# Patient Record
Sex: Female | Born: 1965
Health system: Southern US, Community
[De-identification: ages and names within clinical notes are randomized; demographics above are authoritative.]

## PROBLEM LIST (undated history)

## (undated) DIAGNOSIS — I10 Essential (primary) hypertension: Secondary | ICD-10-CM

## (undated) DIAGNOSIS — N200 Calculus of kidney: Secondary | ICD-10-CM

## (undated) DIAGNOSIS — I471 Supraventricular tachycardia, unspecified: Secondary | ICD-10-CM

## (undated) DIAGNOSIS — R112 Nausea with vomiting, unspecified: Secondary | ICD-10-CM

## (undated) DIAGNOSIS — E042 Nontoxic multinodular goiter: Secondary | ICD-10-CM

## (undated) DIAGNOSIS — Q7101 Congenital complete absence of right upper limb: Secondary | ICD-10-CM

## (undated) DIAGNOSIS — Z9889 Other specified postprocedural states: Secondary | ICD-10-CM

## (undated) DIAGNOSIS — A388 Scarlet fever with other complications: Secondary | ICD-10-CM

## (undated) DIAGNOSIS — Z87442 Personal history of urinary calculi: Secondary | ICD-10-CM

## (undated) HISTORY — DX: Scarlet fever with other complications: A38.8

## (undated) HISTORY — PX: PERCUTANEOUS NEPHROSTOLITHOTOMY: SHX2207

## (undated) HISTORY — PX: OTHER SURGICAL HISTORY: SHX169

---
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
## 1998-02-15 ENCOUNTER — Ambulatory Visit (HOSPITAL_COMMUNITY): Admission: RE | Admit: 1998-02-15 | Discharge: 1998-02-15 | Payer: Self-pay | Admitting: Urology

## 1998-02-15 ENCOUNTER — Encounter: Payer: Self-pay | Admitting: Urology

## 1998-04-22 ENCOUNTER — Other Ambulatory Visit: Admission: RE | Admit: 1998-04-22 | Discharge: 1998-04-22 | Payer: Self-pay | Admitting: Gynecology

## 1999-06-11 ENCOUNTER — Emergency Department (HOSPITAL_COMMUNITY): Admission: EM | Admit: 1999-06-11 | Discharge: 1999-06-12 | Payer: Self-pay | Admitting: Internal Medicine

## 1999-12-12 ENCOUNTER — Encounter: Payer: Self-pay | Admitting: Urology

## 1999-12-12 ENCOUNTER — Encounter: Admission: RE | Admit: 1999-12-12 | Discharge: 1999-12-12 | Payer: Self-pay | Admitting: Urology

## 1999-12-13 ENCOUNTER — Ambulatory Visit (HOSPITAL_COMMUNITY): Admission: RE | Admit: 1999-12-13 | Discharge: 1999-12-14 | Payer: Self-pay | Admitting: Urology

## 1999-12-13 ENCOUNTER — Encounter: Payer: Self-pay | Admitting: Urology

## 2000-04-11 ENCOUNTER — Other Ambulatory Visit: Admission: RE | Admit: 2000-04-11 | Discharge: 2000-04-11 | Payer: Self-pay | Admitting: Gynecology

## 2000-07-03 ENCOUNTER — Encounter: Payer: Self-pay | Admitting: Urology

## 2000-07-03 ENCOUNTER — Ambulatory Visit (HOSPITAL_COMMUNITY): Admission: RE | Admit: 2000-07-03 | Discharge: 2000-07-03 | Payer: Self-pay | Admitting: Urology

## 2000-07-03 ENCOUNTER — Emergency Department (HOSPITAL_COMMUNITY): Admission: EM | Admit: 2000-07-03 | Discharge: 2000-07-04 | Payer: Self-pay | Admitting: Emergency Medicine

## 2000-07-03 HISTORY — PX: OTHER SURGICAL HISTORY: SHX169

## 2000-07-10 ENCOUNTER — Ambulatory Visit (HOSPITAL_COMMUNITY): Admission: RE | Admit: 2000-07-10 | Discharge: 2000-07-10 | Payer: Self-pay | Admitting: Urology

## 2000-07-10 HISTORY — PX: OTHER SURGICAL HISTORY: SHX169

## 2001-03-18 ENCOUNTER — Ambulatory Visit (HOSPITAL_COMMUNITY): Admission: RE | Admit: 2001-03-18 | Discharge: 2001-03-18 | Payer: Self-pay | Admitting: Urology

## 2001-03-19 ENCOUNTER — Encounter: Payer: Self-pay | Admitting: Urology

## 2001-03-19 ENCOUNTER — Encounter: Admission: RE | Admit: 2001-03-19 | Discharge: 2001-03-19 | Payer: Self-pay | Admitting: Urology

## 2002-05-27 ENCOUNTER — Encounter: Admission: RE | Admit: 2002-05-27 | Discharge: 2002-05-27 | Payer: Self-pay | Admitting: Urology

## 2002-05-27 ENCOUNTER — Encounter: Payer: Self-pay | Admitting: Urology

## 2002-05-30 ENCOUNTER — Encounter: Payer: Self-pay | Admitting: Urology

## 2002-05-30 ENCOUNTER — Observation Stay (HOSPITAL_COMMUNITY): Admission: RE | Admit: 2002-05-30 | Discharge: 2002-05-31 | Payer: Self-pay | Admitting: Urology

## 2002-06-05 ENCOUNTER — Ambulatory Visit (HOSPITAL_COMMUNITY): Admission: AD | Admit: 2002-06-05 | Discharge: 2002-06-05 | Payer: Self-pay | Admitting: Urology

## 2003-12-03 ENCOUNTER — Encounter: Admission: RE | Admit: 2003-12-03 | Discharge: 2003-12-03 | Payer: Self-pay | Admitting: Urology

## 2004-04-06 ENCOUNTER — Other Ambulatory Visit: Admission: RE | Admit: 2004-04-06 | Discharge: 2004-04-06 | Payer: Self-pay | Admitting: Obstetrics & Gynecology

## 2004-04-09 ENCOUNTER — Encounter (INDEPENDENT_AMBULATORY_CARE_PROVIDER_SITE_OTHER): Payer: Self-pay | Admitting: Specialist

## 2004-04-09 ENCOUNTER — Ambulatory Visit (HOSPITAL_COMMUNITY): Admission: AD | Admit: 2004-04-09 | Discharge: 2004-04-09 | Payer: Self-pay | Admitting: Obstetrics & Gynecology

## 2004-04-09 HISTORY — PX: DILATION AND CURETTAGE OF UTERUS: SHX78

## 2004-08-25 ENCOUNTER — Ambulatory Visit: Payer: Self-pay | Admitting: *Deleted

## 2004-08-31 ENCOUNTER — Ambulatory Visit: Payer: Self-pay | Admitting: *Deleted

## 2004-12-09 ENCOUNTER — Encounter: Admission: RE | Admit: 2004-12-09 | Discharge: 2004-12-09 | Payer: Self-pay | Admitting: Orthopedic Surgery

## 2005-02-08 ENCOUNTER — Encounter: Admission: RE | Admit: 2005-02-08 | Discharge: 2005-02-08 | Payer: Self-pay | Admitting: Urology

## 2005-02-10 ENCOUNTER — Ambulatory Visit (HOSPITAL_BASED_OUTPATIENT_CLINIC_OR_DEPARTMENT_OTHER): Admission: RE | Admit: 2005-02-10 | Discharge: 2005-02-10 | Payer: Self-pay | Admitting: Urology

## 2005-02-10 ENCOUNTER — Ambulatory Visit (HOSPITAL_COMMUNITY): Admission: RE | Admit: 2005-02-10 | Discharge: 2005-02-10 | Payer: Self-pay | Admitting: Urology

## 2005-02-17 ENCOUNTER — Ambulatory Visit (HOSPITAL_COMMUNITY): Admission: RE | Admit: 2005-02-17 | Discharge: 2005-02-17 | Payer: Self-pay | Admitting: Urology

## 2005-02-17 ENCOUNTER — Ambulatory Visit (HOSPITAL_BASED_OUTPATIENT_CLINIC_OR_DEPARTMENT_OTHER): Admission: RE | Admit: 2005-02-17 | Discharge: 2005-02-17 | Payer: Self-pay | Admitting: Urology

## 2005-02-27 ENCOUNTER — Encounter: Admission: RE | Admit: 2005-02-27 | Discharge: 2005-02-27 | Payer: Self-pay | Admitting: Urology

## 2005-09-06 ENCOUNTER — Encounter: Payer: Self-pay | Admitting: Urology

## 2006-02-22 ENCOUNTER — Ambulatory Visit: Payer: Self-pay | Admitting: *Deleted

## 2006-04-17 ENCOUNTER — Encounter: Admission: RE | Admit: 2006-04-17 | Discharge: 2006-04-17 | Payer: Self-pay | Admitting: Urology

## 2006-04-27 ENCOUNTER — Encounter: Admission: RE | Admit: 2006-04-27 | Discharge: 2006-04-27 | Payer: Self-pay | Admitting: Urology

## 2006-05-02 ENCOUNTER — Ambulatory Visit (HOSPITAL_COMMUNITY): Admission: RE | Admit: 2006-05-02 | Discharge: 2006-05-03 | Payer: Self-pay | Admitting: Urology

## 2006-05-04 ENCOUNTER — Ambulatory Visit (HOSPITAL_COMMUNITY): Admission: RE | Admit: 2006-05-04 | Discharge: 2006-05-04 | Payer: Self-pay | Admitting: Urology

## 2007-03-01 ENCOUNTER — Ambulatory Visit: Payer: Self-pay | Admitting: Cardiology

## 2007-07-16 ENCOUNTER — Encounter: Admission: RE | Admit: 2007-07-16 | Discharge: 2007-07-16 | Payer: Self-pay | Admitting: Obstetrics & Gynecology

## 2008-01-15 ENCOUNTER — Encounter: Admission: RE | Admit: 2008-01-15 | Discharge: 2008-01-15 | Payer: Self-pay | Admitting: Urology

## 2008-01-22 ENCOUNTER — Ambulatory Visit (HOSPITAL_BASED_OUTPATIENT_CLINIC_OR_DEPARTMENT_OTHER): Admission: RE | Admit: 2008-01-22 | Discharge: 2008-01-22 | Payer: Self-pay | Admitting: Urology

## 2008-01-22 HISTORY — PX: OTHER SURGICAL HISTORY: SHX169

## 2008-02-26 ENCOUNTER — Ambulatory Visit: Payer: Self-pay | Admitting: Cardiology

## 2008-08-12 ENCOUNTER — Encounter: Admission: RE | Admit: 2008-08-12 | Discharge: 2008-08-12 | Payer: Self-pay | Admitting: Obstetrics & Gynecology

## 2008-12-16 ENCOUNTER — Encounter (INDEPENDENT_AMBULATORY_CARE_PROVIDER_SITE_OTHER): Payer: Self-pay | Admitting: *Deleted

## 2009-04-15 DIAGNOSIS — I10 Essential (primary) hypertension: Secondary | ICD-10-CM

## 2009-04-15 DIAGNOSIS — E7200 Disorders of amino-acid transport, unspecified: Secondary | ICD-10-CM | POA: Insufficient documentation

## 2009-04-15 DIAGNOSIS — Z8679 Personal history of other diseases of the circulatory system: Secondary | ICD-10-CM | POA: Insufficient documentation

## 2009-04-16 ENCOUNTER — Ambulatory Visit: Payer: Self-pay | Admitting: Cardiology

## 2009-07-02 ENCOUNTER — Encounter: Admission: RE | Admit: 2009-07-02 | Discharge: 2009-07-02 | Payer: Self-pay | Admitting: Urology

## 2009-08-16 ENCOUNTER — Encounter: Admission: RE | Admit: 2009-08-16 | Discharge: 2009-08-16 | Payer: Self-pay | Admitting: Obstetrics & Gynecology

## 2009-11-03 ENCOUNTER — Telehealth: Payer: Self-pay | Admitting: Cardiology

## 2010-06-07 NOTE — Progress Notes (Signed)
Summary: skipping beats/flutter  Phone Note Call from Patient Call back at Home Phone [phone] Call back at [phone]   Caller: Patient Reason for Call: Talk to Nurse Summary of Call: heart skipping beats on Sunday and flutteringtoday,thought she was going to pass out; request to speak to nurse Initial call taken by: Migdalia Dk,  November 03, 2009 2:36 PM  Follow-up for Phone Call        spoke with pt, she had an episode of heart fluttering on sunday evening and has felt fine since then until today. she was sitting at her desk, pushed her chair back and suddenly she had severe flutting in her chest. she states she felt faint and the room was going black. she put her head down between her legs and after one minute the palpitations went away. she was dizzy after the episode for about 1-2 minutes but feels fine now. she takes her toprol once daily. denies any opther symptoms. will foward for dr Jens Som review Deliah Goody, RN  November 03, 2009 3:20 PM   Additional Follow-up for Phone Call Additional follow up Details #1::        schedule cardionet and then f/u ov Ferman Hamming, MD, [facility]  November 03, 2009 3:25 PM  spoke with pt, she does not want to do the monitor right now. she has not been sleeping well and feels that may be a contributing factor. she wants to wait and see if it happens again. if episode reoccurs she will call me back Deliah Goody, RN  November 03, 2009 3:44 PM

## 2010-07-21 ENCOUNTER — Other Ambulatory Visit: Payer: Self-pay | Admitting: Obstetrics & Gynecology

## 2010-07-21 DIAGNOSIS — Z1231 Encounter for screening mammogram for malignant neoplasm of breast: Secondary | ICD-10-CM

## 2010-08-23 ENCOUNTER — Ambulatory Visit
Admission: RE | Admit: 2010-08-23 | Discharge: 2010-08-23 | Disposition: A | Discharge status: Home / Self Care | Payer: Self-pay | Source: Ambulatory Visit | Attending: Obstetrics & Gynecology | Admitting: Obstetrics & Gynecology

## 2010-08-23 DIAGNOSIS — Z1231 Encounter for screening mammogram for malignant neoplasm of breast: Secondary | ICD-10-CM

## 2010-09-20 NOTE — Assessment & Plan Note (Signed)
[facility]                            CARDIOLOGY OFFICE NOTE   NAME:[name] [name], [name] [name]                      MRN:          [id number]  DATE:02/26/2008                            DOB:          Feb 28, 1966    [name] [name] is an extremely pleasant 45 year old female who has a  history of hypertension, SVT, and cystinuria.  A previous echocardiogram  in September 2003, showed normal LV function and Myoview performed in  September 2003, showed no scar or ischemia with an ejection fraction  74%.  Since I last saw her, she has lost approximately 30 pounds.  She  does state that occasionally she feels dizziness with standing that  improves after several seconds.  She has not had frank syncope.  She  denies any chest pain or dyspnea on exertion and there is no pedal  edema.  Also note, she apparently has problems of cystinuria and had  kidney stones.  She said that her neurologist suggested possible  initiation of captopril, but she was hesitant to do this as a blood  pressure runs in the systolic of 90-100.   MEDICATIONS:  1. Aspirin 81 mg p.o. daily as needed.  2. Toprol 50 mg p.o. daily.   PHYSICAL EXAMINATION:  VITAL SIGNS:  Blood pressure of 109/62 and pulse  is 64.  HEENT:  Normal.  NECK:  Supple.  CHEST:  Clear.  CARDIOVASCULAR:  Regular rate and rhythm.  EXTREMITIES:  No edema.   Electrocardiogram shows sinus bradycardia at a rate of 53.  The axis is  normal.  No ST changes noted.  Early repolarization abnormality noted.   DIAGNOSIS:  1. Supraventricular tachycardia - the patient appears to be doing well      from a symptomatic standpoint.  Note, we did discuss discontinuing      her Toprol and potentially starting captopril for cystinuria.      However, she states that when she has been off of Toprol in the      past in 1 day, she has increased palpitations.  We will, therefore,      not do this.  She also is having orthostatic symptoms and   apparently her blood pressure is running low.  I would decrease the      Toprol to 25 mg p.o. daily.  If her palpitations worsen then we      could potentially increase this in the future versus trying to      proceed with cardiac event monitor.  If we document significant      arrhythmia then we could potentially refer for ablation as      indicated.  2. Hypertension - as per above, we are decreasing her Toprol due to      orthostatic symptoms.  3. Cystinuria - I have recommended that she did not start captopril as      I think this would exacerbate her orthostatic symptoms and may      cause hypotension.   We will see her back in 12 months.     Madolyn Frieze Jens Som, MD, [facility]  Electronically  Signed    BSC/MedQ  DD: 02/26/2008  DT: 02/26/2008  Job #: [id number]

## 2010-09-20 NOTE — Op Note (Signed)
NAME:  [name] [name], [name] [name]               ACCOUNT NO.:  [id number]   MEDICAL RECORD NO.:  [id number]          PATIENT TYPE:  AMB   LOCATION:  [facility]                         FACILITY:  [facility]   PHYSICIAN:  Mark C. Vernie Ammons, M.D.  DATE OF BIRTH:  1966/02/12   DATE OF PROCEDURE:  DATE OF DISCHARGE:                               OPERATIVE REPORT   PREOPERATIVE DIAGNOSIS:  Bilateral renal calculi with symptomatic right  renal calculi.   POSTOPERATIVE DIAGNOSIS:  Bilateral renal calculi.   PROCEDURE:  1. Cystoscopy.  2. Bilateral ureteroscopy.  3. Bilateral stone extraction.  4. Bilateral double-J stent placement.  5. Left in situ laser lithotripsy.   SURGEON:  Mark C. Vernie Ammons, M.D.   ANESTHESIA:  General.   SPECIMENS:  Stone given to the patient.   BLOOD LOSS:  Minimal.   DRAINS:  5-French 24-cm Polaris stent with string in both right and left  ureters.   COMPLICATIONS:  None.   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  She has  been having intermittent pain in her right flank for about 1 week.  A CT  scan revealed one 5-mm stone and 1 punctate stone in her right kidney as  well as a 7-mm and 5-mm stone in the left kidney.  We discussed  treatment options and because she is symptomatic on the right-hand side,  she elected to proceed with ureteroscopic extraction of the stone.  My  thought was that the stone was likely ball valving into the  ureteropelvic junction and causing her intermittent pain.  The risks,  complications and alternatives have been discussed.  She understands and  elected to proceed.   DESCRIPTION OF OPERATION:  After informed consent, the patient was  brought to the major OR, placed on the table and administered general  anesthesia, then moved to the dorsal lithotomy position.  Her genitalia  were sterilely prepped and draped and an official time-out was then  performed.   Initially the 22-French cystoscope was inserted into the  bladder.  The bladder was fully inspected and noted be free of any  tumor, stones or inflammatory lesions.  The ureteral orifices were noted  to be normal in configuration and position.  The right orifice was  identified and a 0.038- inch floppy-tip guidewire was then passed  through the cystoscope, up the right ureter under direct fluoroscopy  into the area of the renal pelvis.  I then removed the cystoscope and  performed ureteral dilation.   The ureter was dilated gently initially with the inner cannula of the  ureteral access sheath.  This dilated very smoothly and easily.  I then  removed that and then reassembled the entire ureteral access sheath and  then passed this over the guidewire easily into the area of the renal  pelvis.  The inner cannula and guidewire were then removed.   The 6-French flexible ureteroscope was then passed up the access sheath  into the area of the renal pelvis.  Initially I noted there were no  stones seen  free floating within the renal pelvis.  Visualization was  excellent.  I then did a systematic inspection of all calyces, starting  at the upper pole and noting it was compound.  Each calix was inspected  and no stones were seen in the upper pole.  In the middle pole a single  calix was found to have a stone but it appeared to be wedged against a  papilla and the wall of the infundibulum and did not appear to be free  floating.  I inspected the lower pole calix and found no stones in any  of these.   Using the nitinol basket, I was able to manipulate the stone in the mid  to lower pole calix and I was then able to extract it completely.  I  then reinserted the ureteroscope into the kidney and reinspected the  kidney again a second time, confirming my location using intermittent  pulse fluoroscopy.  I inspected the entire kidney and no further stones  could be identified within the kidney,  however, in an upper pole calix,  I could see a fragment of uric acid stone beneath the mucosa of the  papilla.  This certainly did not warrant extraction as it was completely  imbedded within the kidney.  I therefore passed a guidewire up the  ureter into the area of the renal pelvis and removed the access sheath  as well as ureteroscope.   I then called the patient's husband, who was in the waiting room, and  discussed the fact that I was easily able to extract all stones on the  right-hand side and because she had 2 known stones on the left-hand  side, proposed removing those as well.  The decision was made by her  husband and myself to proceed with treatment of the left side since she  was under general anesthesia in order to completely rid her of stones.   I therefore dilated the left ureter in an identical fashion, first  passing the guidewire using the cystoscope, then using the inner portion  and outer portions of the ureteral access sheath, followed by the  flexible ureteroscope.  I was able to identify a very large stone in a  mid pole calix and grasped that with the nitinol basket.  I was able to  extract it nearly all the way but it got held up in the intramural  ureter and therefore I left it in the nitinol basket and used the 6-  [place] rigid ureteroscope to visualize the stone and fragment it.   Through the 6-French rigid ureteroscope, a 360 micron holmium laser  fiber was passed and with a setting of 0.6 joules and a rate of 6 Hz, I  fragmented the stone.  I then removed each of the fragments with the  nitinol basket.  I then reinspected the distal ureter with the rigid  ureteroscope and noted no further fragments present.  I then replaced  the guidewire, replaced the ureteral access sheath, and passed the  flexible scope into the kidney again.  I continued my systematic search  of all calyces and found a second stone in a calix in the lower pole.  This appeared  distorted by previous surgery but I was able to grasp the  stone in the basket and then was able to extract it completely with the  ureteroscope.  I then reinserted the ureteroscope a last time,  reinspected the entire left kidney and looked in each calix and found  no  further stones present.  I therefore replaced the guidewire and removed  the ureteroscope and access sheath.   I back loaded the cystoscope over the guidewire and first passed the  stent up the left ureter under direct fluoroscopy.  As I removed the  guidewire, good curl was noted in the left renal pelvis.  I left the  string affixed to the end of the stent and proceeded to place a stent on  the right-hand side in an identical fashion.  The stents were left with  the tethers attached, which were affixed to the lower abdomen, and the  patient was awakened and taken to the recovery room in stable and  satisfactory condition.  She tolerated the procedure well.  There were  no intraoperative complications.   She is going to be given a prescription for Vicodin HP #40, Cipro 250 mg  h.s. #7, and Pyridium 200 mg q.8 h p.r.n. #40.  She will return to my  office in 5-6 days for stent removal.      Mark C. Vernie Ammons, M.D.  Electronically Signed     MCO/MEDQ  D:  01/22/2008  T:  01/23/2008  Job:  [id number]

## 2010-09-20 NOTE — Assessment & Plan Note (Signed)
[facility]                            CARDIOLOGY OFFICE NOTE   NAME:[name] [name], [name] [name]                      MRN:          [id number]  DATE:03/01/2007                            DOB:          02/15/1966    [name] [name] is an extremely pleasant 45 year old female who has a  history of hypertension, SVT, and cystinuria.  She has previously been  followed by Dr. Glennon Hamilton.  She had her last echocardiogram in  September, 2003.  Her ejection fraction was normal.  There was trace  mitral regurgitation.  She also apparently had a Myoview performed in  September, 2003 that showed no scar or ischemia, and her ejection  fraction was 74%.  She has done well and has taken Toprol for her  palpitations in the past.  According to the notes, she does have SVT,  although I do see no actual rhythm strips from this.  Since she was last  seen, there is no dyspnea or chest pain and only rare palpitations that  are self-limited.  There has been no syncope.   MEDICATIONS:  1. Aspirin 81 mg p.o. daily.  2. Toprol 50 mg p.o. daily.   PHYSICAL EXAMINATION:  Blood pressure 126/80, pulse 75.  She weighs 155  pounds.  HEENT:  Normal.  NECK:  Supple with no bruits.  CHEST:  Clear.  CARDIOVASCULAR:  Regular rate and rhythm with no murmurs, rubs or  gallops.  ABDOMEN:  No tenderness.  EXTREMITIES:  No edema.   Electrocardiogram shows a sinus rhythm, and her rate is 75.  There are  minor nonspecific ST changes.   DIAGNOSES:  1. History of supraventricular tachycardia:  We will continue with her      Toprol and her symptoms are well controlled at present.  If she has      worsening palpitations in the future, then we will most likely      proceed with a Cardionet monitor to document her rhythm      disturbance.  We have refilled her Toprol for 24 months, and I will      see her back at that time.  2. Hypertension:  Her blood pressure is well controlled on her present  medications.  3. Cystinuria.     Madolyn Frieze Jens Som, MD, [facility]  Electronically Signed    BSC/MedQ  DD: 03/01/2007  DT: 03/01/2007  Job #: [id number]

## 2010-09-23 NOTE — Letter (Signed)
February 22, 2006     Ernestina Penna, M.D.  [address] [place], [place] [address]   RE:  NIMCO, BIVENS  MRN:  [id number]  /  DOB:  Dec 28, 1965   Dear Roe Coombs,   It was a pleasure to see this nice patient, [name] [name], for followup on  February 22, 2006.  As you know, she is a very pleasant 45 year old married  white female followed by me since 1971 because of paroxysmal atrial  tachycardia.  Initially, she had a diagnosis of mitral valve prolapse;  however, a 2D echo has not confirmed this.  Since her last visit a year and  a half ago, patient got along quite well with no palpitations.  She limits  her caffeine, and she is on Toprol XL 50 mg daily.   She is working as a Production designer, theatre/television/film at [employer] and is doing  extremely well.   PHYSICAL EXAMINATION:  VITAL SIGNS:  Blood pressure 107/73, pulse 51, normal  sinus rhythm.  GENERAL APPEARANCE:  Normal.  NECK:  JVP not elevated.  Carotid pulses bilaterally equal without bruits.  LUNGS:  Clear.  CARDIAC:  Normal.  No murmur, click, or gallop.  ABDOMEN:  Normal.   EKG is normal.   IMPRESSION:  A long history of ventricular atrial tachycardia, now well  controlled on beta blocker.   Patient will be seen in a year or two or p.r.n.   Thank you for the opportunity to share in this nice patient's care.    Sincerely,     ______________________________  E. Graceann Congress, MD, [facility]    EJL/MedQ  DD: 02/22/2006  DT: 02/25/2006  Job #: [id number]

## 2010-09-23 NOTE — Op Note (Signed)
NAME:  [name], [name]               ACCOUNT NO.:  [id number]   MEDICAL RECORD NO.:  [id number]          PATIENT TYPE:  AMB   LOCATION:  [facility]                         FACILITY:  [facility]   PHYSICIAN:  Mark C. Vernie Ammons, M.D.  DATE OF BIRTH:  07-20-1965   DATE OF PROCEDURE:  02/10/2005  DATE OF DISCHARGE:                                 OPERATIVE REPORT   PREOPERATIVE DIAGNOSIS:  Right renal calculus.   POSTOPERATIVE DIAGNOSIS:  History of right renal calculus and cystinuria.   PROCEDURE:  1.  Cystoscopy.  2.  Right retrograde pyelogram with interpretation.  3.  Diagnostic right ureteroscopy.   SURGEON:  Dr. Vernie Ammons   ANESTHESIA:  General.   BLOOD LOSS:  Zero.   SPECIMENS:  None.   DRAINS:  None.   COMPLICATIONS:  None.   INDICATIONS:  The patient has a history of cystinuria.  For a week, she has  had progressively worsening right flank pain.  This culminated in a CT scan  that revealed a 3-mm stone in the mid to upper portion of the right kidney.  She said the morning of her surgery, she had some pain, but she was pain  free at this time.  She has not see a stone pass.  She has elected to have a  ureteroscopic extraction of the stone.   DESCRIPTION OF OPERATION:  After informed consent, the patient brought to  the major OR, placed on the table, administered general anesthesia, then  moved to the dorsal lithotomy position.  Her genitalia was sterilely prepped  and draped, and a 21-French cystoscope was then introduced in the bladder  with the 12-degree lens.  The bladder was fully inspected and  noted be free  of any tumor, stones, or inflammatory lesions.  The right orifice was  identified, and 0.038 inch floppy tip guidewire was then passed up the right  ureter under direct fluoroscopic control into the right renal pelvis.  A 6-  French flexible ureteroscope was then passed over the guidewire under direct  fluoroscopic control, and the guidewire was removed.  The scope passed  up  the ureter without difficulty.  I then inspected each calix individually in  a systematic fashion and was unable to identify a stone whatsoever.  She had  numerous renal papillae that appeared slightly enlarged and had a whitish  coloration to them.  But no stone could be seen.   A retrograde pyelogram was then performed by injecting contrast through the  flexible ureteroscope.  In doing so, it fully outlined the collecting system  and allowed each of the calix to be visualized.  The caliceal anatomy  appeared normal.  I saw no filling defects or other abnormality.  Using a  combination of fluoroscopy and direct visualization, I advanced the  ureteroscope into each and every calix but was unable to identify a stone.  I checked the renal pelvis and found no stone there, and therefore withdrew  the scope under direct vision and saw no evidence of stone passage nor did I  see a stone in the ureter.  The bladder was  drained after the ureteroscope  was removed, and the patient was awakened and taken to the recovery room in  stable satisfactory condition. She tolerated the procedure well.  There were  no intraoperative complications.  She had received 30 mg of IV Toradol.   At this point, since I can find no stone, we will see how she does over the  next couple of days.  If she continues to have difficulty, I will have her  contact me.  Otherwise, it appears she either passed her stone, or it is  possible that the calcification could represent some cysteine crystals  within the renal patella.      Mark C. Vernie Ammons, M.D.  Electronically Signed     MCO/MEDQ  D:  02/10/2005  T:  02/10/2005  Job:  [id number]

## 2010-09-23 NOTE — Op Note (Signed)
[facility]  Patient:    [name] [name], [name] [name]                      MRN: [id number] Proc. Date: 07/10/00 Adm. Date:  [id number] Attending:  Trisha Mangle                           Operative Report  PREOPERATIVE DIAGNOSIS:  Right ureteral obstruction with hydronephrosis.  POSTOPERATIVE DIAGNOSIS:  Right ureteral obstruction with hydronephrosis.  PROCEDURE:  Cystoscopy and double-J stent placement.  SURGEON:  Mark C. Vernie Ammons, M.D.  ANESTHESIA:  Intravenous sedation.  DRAINS:  4.5 French 24 cm double-J stent in the right ureter.  ESTIMATED BLOOD LOSS:  None.  INDICATIONS:  Patient is a 45 year old white female who had ureteroscopic stone extraction from the right kidney last week.  Her stent became partially dislodged, and she became totally incontinent, as it was hanging out of the urethra and was therefore removed.  She had a short period of classic ureteral spasm with ureteral colic but has continued to have right flank pain and yesterday presented to my office with continued right flank pain and IVP and ultrasound that revealed hydronephrosis down to the UVJ.  She is brought to the OR today for replacement of her stent.  DESCRIPTION OF OPERATION:  After informed consent, the patient was brought to the OR and placed on the table, administered intravenous sedation, and her genitalia was sterilely prepped and draped.  The 22 French cystoscope was introduced into the urethra.  Ureteral orifices identified and noted to be mildly edematous and cannulated with an open-ended 6 French ureteral catheter. The 0.03 inch floppy-tipped Glidewire was then passed up the ureter without difficulty into the area of the renal pelvis, and the open-ended catheter was removed.  I passed the stent over the guidewire into the area of the renal pelvis without difficulty, removing the guidewire with good curl being noted in the renal pelvis and bladder.  The patient was  awakened and taken to the recovery room in stable and satisfactory condition.  She tolerated the procedure well.  She will follow up in my office in one week to get her stent out. DD:  07/10/00 TD:  07/11/00 Job: [id number]

## 2010-09-23 NOTE — Op Note (Signed)
[place]. [facility]  Patient:    [name] [name], [name] [name]                      MRN: [id number] Proc. Date: 12/13/99 Adm. Date:  [id number] Disc. Date: [id number] Attending:  Trisha Mangle CC:         Arnell Sieving, M.D.   Operative Report  PREOPERATIVE DIAGNOSIS:  Right renal calculus.  POSTOPERATIVE DIAGNOSIS:  Right renal calculus.  PROCEDURE: 1. Cystoscopy. 2. Right retrograde pyelogram with balloon occlusion stent placement and right    percutaneous nephrostolithotomy.  SURGEON:  Dr. Vernie Ammons.  RADIOLOGIST:  Dr. Sharin Mons.  DRAINS:  A 20 French counsel tip catheter as a nephroscopy tube, and an 72 [place] Foley.  SPECIMENS:  Stone given to patient.  ESTIMATED BLOOD LOSS:  100 cc.  COMPLICATIONS:  None.  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  Her stones have been well controlled with Thiola historically, however, she has been trying to get pregnant and needs to be off the Thiola in case of pregnancy.  Because of that I have used more conservative means to try to prevent stone formation, although she has redeveloped a large right renal calculus.  It measured 2.5 cm and was located in the upper pole of the right kidney with no hydronephrosis.  She understands the risks and complications, as well as limitations to this procedure, and has elected to proceed.  DESCRIPTION OF PROCEDURE:  After informed consent, the patient is brought to the major operating room, placed on the table, administered general anesthesia, and then moved to the dorsolithotomy position.  Her genitalia was sterilely prepped and draped and a 22 French cystoscope was inserted into the bladder.  The bladder was fully inspected and noted to be free of tumors, stones, inflammatory lesions.  The right ureteral orifice was then identified, and double lumen occlusion stent passed up the right ureter approximately half way where  the guide wire from the occlusion stent was removed and retrograde pyelogram was performed.  It revealed nice delicate calices with a large filling defect in the upper pole, in an upper pole calix.  No other filling defects or abnormalities were noted.  I therefore filled the occlusion balloon with approximately 5 cc of half strength contrast, and left this in place, removing the cystoscope and securing the balloon occlusion stent to Foley catheter.  The patient was then moved from the dorsolithotomy position on to another bed in the prone position.  Her right flank was then sterilely prepped and draped, and the balloon occlusion stent was then used with fluoroscopy to distend the renal pelvis.  After this was performed Dr. Lyman Bishop then performed percutaneous nephrostomy tube access by gaining access to the kidney, placing a guide wire down the ureter, and then dilating the tract, and then placing the 30 French implant access sheath into the area of the renal pelvis under fluoroscopic control.  The 83 French rigid nephroscope was then used, and there was a lot of irritation of the upper pole infundibulum, but no suspicious lesions were identified.  The guide wire was noted to be passing down the ureter.  The scope was then directed cephalad, and the stone was visualized and photographed.  I then used the ultrasound probe to gently fragment the stone into large bits which were then grasped with three prong graspers and extracted.  All of the stone was grasped and removed leaving no  fragments behind.  I then left the guide wire in place and removed the nephroscope after first visualizing the UPJ region where a final stone was found lodged and removed.  I then removed the scope and the implant sheath and passed the counsel tip catheter over the guide wire into the are of the renal pelvis filling the balloon with half strength contrast using approximately 3 cc. This was then secured to the  skin with a 2-0 silk suture, and connected to closed system drainage, and a sterile occlusive dressing was applied.  The patient was awakened and taken to the recovery room in stable satisfactory condition.  She tolerated the procedure well, and there were no intraoperative complications.  She will be observed overnight, maintained on intravenous antibiotics with anticipation of discharge in the morning. DD:  12/13/99 TD:  12/14/99 Job: [id number]

## 2010-09-23 NOTE — Op Note (Signed)
[facility]  Patient:    [name] [name], [name] [name]                      MRN: [id number] Adm. Date:  [id number] Disc. Date: [id number] Attending:  Benny Lennert                           Operative Report  PREOPERATIVE DIAGNOSES:  Right renal calculus and intermittent colic.  POSTOPERATIVE DIAGNOSES:  Right renal calculus and intermittent colic.  PROCEDURE:  Cystoscopy, right retrograde pyelogram with interpretation, right ureteroscopy, and stone basketing with double-J stent placement.  SURGEON:  Mark C. Vernie Ammons, M.D.  ANESTHESIA:  General.  DRAINS:  24 cm 4.5 French double-J stent in the right ureter with string.  ESTIMATED BLOOD LOSS:  Less than 5 cc.  SPECIMEN:  Stone to patient.  COMPLICATIONS:  None.  INDICATIONS:  This is a 45 year old white female ______ , who has had multiple stone recurrences.  She has recently been plagued by recurrent right-sided flank pain and was found on CT and ultrasound to have 4 mm stone to the mid lower pole of the right kidney, possibly 2-3 other stones present. No ureteral stones were noted.  Mild hydronephrosis was present.  DESCRIPTION OF PROCEDURE:  She is brought to the operating room today for surgical therapy of her right renal calculi and understands the risks, complications, and alternatives.  Informed consent was obtained, and the patient was placed on the table in a supine position and administered general anesthesia and then moved to the dorsal lithotomy position, after which her genitalia was sterilely prepped and draped.  I initially inserted the 6 French rigid ureteroscope into the bladder and identified the right ureteral orifice and passed the scope under direct visualization up the ureter into the area of the renal pelvis.  No stones were identified throughout its passage, and the ureter appeared normal throughout its length.  The 0.03 inch floppy-tipped guidewire was then passed through the  ureteroscope, and it was then removed with the guidewire being left in place.  A 14 French ureteral access sheath was then placed over the guidewire under fluoroscopic control with the obturator being removed.  Initially attempted to pass the 9 French flexible ureteroscope, but it would not pass easily through the access sheath, so I chose the 6 French flexible ureteroscope and passed this easily into the kidney.  A dilute contrast was then injected, and a full pyelogram was noted with no distinct filling defects other than the tips of the papilla.  The scope was then placed in each individual infundibulum, and each calix was fully and completely inspected.  In doing so, I found a small, flat stone in the mid to lower calix.  A 3 French nitinol basket was then used to grasp the stone, and it was extracted with the scope.  I then reinserted the scope and then again injected contrast and made a thorough and systematic evaluation of every single calix, finding no further stones.  The renal pelvis was inspected, and it was free of stones, and I then reinserted a guidewire and backed the scope over the guidewire, again noting no ureteral stones.  The cystoscope was backloaded over the guidewire, and the double-J stent was then passed with good flow being noted in the renal pelvis and bladder.  The bladder was drained, and the patient was awakened and taken to the recovery room in stable and satisfactory  condition.  She tolerated the procedure well. There were no intraoperative complications, and she received 30 mg of Toradol IV postoperatively.  The string that has been left on her stent will be used to remove the stent later this week, and she will be given a prescription for Toradol for pain. DD:  07/03/00 TD:  07/04/00 Job: [id number]

## 2010-09-23 NOTE — Op Note (Signed)
NAME:  [name] [name], [name] [name] NO.:  [id number]   MEDICAL RECORD NO.:  [id number]          PATIENT TYPE:  AMB   LOCATION:  SDC                           FACILITY:  [facility]   PHYSICIAN:  Gerrit Friends. Aldona Bar, M.D.   DATE OF BIRTH:  1966-03-08   DATE OF PROCEDURE:  04/09/2004  DATE OF DISCHARGE:                                 OPERATIVE REPORT   PREOPERATIVE DIAGNOSES:  1.  Spontaneous incomplete abortion.  2.  Blood type O negative.   POSTOPERATIVE DIAGNOSES:  1.  Spontaneous incomplete abortion.  2.  Blood type O negative.  3.  Pathology pending.   PROCEDURE:  Suction dilatation and curettage for evacuation of incomplete  abortion.   SURGEON:  Gerrit Friends. Aldona Bar, M.D.   ANESTHESIA:  Intravenous conscious sedation and paracervical block to  cervix.   HISTORY:  This 45 year old patient was seen by me in the office three days  ago with a 7-week intrauterine pregnancy.  Today, she began having bleeding,  clotting, and cramping, called and was advised to come in and be seen.  On  evaluation, her cervix was opened to 1 cm and on ultrasound, only debris was  remaining in the cavity.  Diagnosis of spontaneous incomplete abortion was  made, and because the patient was bleeding fairly heavily, a decision was  made to proceed with a D&C for complete evacuation and to create hemostasis.   The patient was taken to the operating now for suction D&C because of a  spontaneous incomplete abortion.   DESCRIPTION OF PROCEDURE:  The patient was taken to the operating room where  after satisfactory induction of intravenous conscious sedation, she was  prepped and draped having been placed in the modified lithotomy position in  the short Allen stirrups.  She was prepped and draped.  A speculum was  placed in the vagina.  A single-toothed tenaculum was placed on the anterior  lip of the cervix, and a paracervical block was carried out with  approximately 10 cc of 1% Xylocaine with epinephrine.   The internal os was  opened to a #29 Pratt dilator, and thereafter using a #8 suction curette,  the cavity was thoroughly, gently and systematically curetted and evacuated  of remaining clot and debris.  Curettage with a small standard curette was  then carried out with no production of further tissue, and bleeding was  minimal at that time.  Resuctioning produced no additional tissue, and as  mentioned, the uterus was contracting well.  The blood loss was minimal at  this point.  The bladder was draining clear urine with a red rubber  catheter, and the procedure was felt to be complete and was terminated.  All  instruments were removed, and the patient was taken to the recovery room in  satisfactory condition, having tolerated the procedure  well.  The patient will be discharged to home after observation in recovery.  She will take doxycycline 100 mg twice daily for a total of four days to  avoid infection and will use Advil or Aleve as needed for cramping.  She  will  also be given RhoGAM prior to leaving [facility].      RMW/MEDQ  D:  04/09/2004  T:  04/10/2004  Job:  [id number]

## 2010-09-23 NOTE — Op Note (Signed)
NAME:  SHARNAY, CASHION                         ACCOUNT NO.:  [id number]   MEDICAL RECORD NO.:  [id number]                   PATIENT TYPE:  AMB   LOCATION:  DAY                                  FACILITY:  [facility]   PHYSICIAN:  Mark C. Vernie Ammons, M.D.               DATE OF BIRTH:  12/21/1965   DATE OF PROCEDURE:  06/05/2002  DATE OF DISCHARGE:                                 OPERATIVE REPORT   PREOPERATIVE DIAGNOSIS:  Persistent drainage from the left percutaneous  nephrostolithotomy site.   POSTOPERATIVE DIAGNOSIS:  Persistent drainage from the left percutaneous  nephrostolithotomy site.   PROCEDURE:  Cystoscopy, left retrograde pyelogram with interpretation, left  double-J stent placement.   SURGEON:  Mark C. Vernie Ammons, M.D.   ANESTHESIA:  General.   DRAINS:  A 6 French 26 cm double-J stent in the left ureter.   ESTIMATED BLOOD LOSS:  None.   SPECIMENS:  None.   COMPLICATIONS:  None.   INDICATIONS:  The patient is a 45 year old white female who last week  underwent a left percutaneous nephrostolithotomy with very successful  removal of all 15 stones found within the kidney. She had a nephrostomy to  gravity drainage and came back to my office 24 hours ago. A gravity  nephrostogram revealed antegrade flow into the bladder without obstruction.  Despite that she continues to have excessive drainage from her flank wound.  She was brought to the operating room for stent placement.   DESCRIPTION OF PROCEDURE:  After informed consent was obtained the patient  was brought to the main OR and placed on the operating table and  administered general anesthesia. She was placed in the dorsal lithotomy  position. The genitalia were sterilely prepped and draped.   A 21 French cystoscope was introduced into the bladder and the left ureteral  orifice was identified. It was mildly edematous, but had no lesions. A 6  French open end ureteral catheter was then  passed into the left ureteral  orifice and a left retrograde pyelogram was performed in a standard fashion.  It revealed no filling defects or strictures throughout the course of the  ureter. The contrast could be seen to pass up into the area of the renal  pelvis and into the lower pole calyx and out through the nephrostomy tract.   Through the open ended catheter a guide wire was then passed in the area of  the renal pelvis and a double-J stent was passed over the guide wire into  the renal pelvis with good curl being noted in the area of the pelvis and  bladder. I then removed the cystoscope after the patient's bladder was  drained.   A B&O suppository was inserted as well as a 14 French Foley catheter. This  will be hooked to closed system drainage and the patient will be discharged.  She will follow up in my office next week for stent  removal.                                               Mark C. Vernie Ammons, M.D.    MCO/MEDQ  D:  06/05/2002  T:  06/05/2002  Job:  [id number]

## 2010-09-23 NOTE — Op Note (Signed)
NAME:  [name] [name], [name] [name] NO.:  [id number]   MEDICAL RECORD NO.:  [id number]          PATIENT TYPE:  AMB   LOCATION:  DAY                          FACILITY:  [facility]   PHYSICIAN:  Mark C. Vernie Ammons, M.D.  DATE OF BIRTH:  [birth year]   DATE OF PROCEDURE:  05/02/2006  DATE OF DISCHARGE:                               OPERATIVE REPORT   PREOPERATIVE DIAGNOSIS:  Left renal calculus.   POSTOPERATIVE DIAGNOSIS:  Left renal calculus.   PROCEDURE:  Left percutaneous nephrostolithotomy.   SURGEON:  Dr. Vernie Ammons   RADIOLOGIST:  Dr. Bonnielee Haff   ANESTHESIA:  General.   BLOOD LOSS:  Minimal.   DRAINS:  16-French Foley catheter in the bladder, 20-French council tip  catheter with a 5-French ureteral catheter in the left kidney and down  the left ureter.   SPECIMENS:  Stone given to the patient.   COMPLICATIONS:  None.   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  She has had multiple cystine stones because of this  condition and has developed a large left renal pelvic stone that was  partially occluding the upper pole causing some mild upper pole  hydronephrosis.  We have discussed the treatment options, and she has  elected to proceed with a percutaneous nephrostolithotomy.   DESCRIPTION OF OPERATION:  After informed consent, the patient was  brought to the major OR, placed on the table, administered general  anesthesia and then moved to the prone position.  Dr. Bonnielee Haff had  previously placed a nephrostomy tube in the radiology suite earlier in  the day, and the patient had received 400 mg of IV Cipro.  Her left  flank was sterilely prepped and draped, and then Dr. Bonnielee Haff passed  guidewires down through with the assistance of coaxial catheter down the  left ureter, 1 as a safety wire, 1 as a working guidewire over which he  then dilated the tract with the Trackmaster balloon and a 28-French  nephroscopic access sheath was then passed over  the balloon into the  area of the renal pelvis under direct fluoroscopic control.   I then inserted the 26-French rigid nephroscope and was able to  visualize the stone without difficulty.  I then used the combination  ultrasonic, pneumatic stone-fragmenting device passed through the  nephroscope and was able to fragment the stone.  I then grasped each of  the fragments with two-pronged graspers and extracted those.  After  completely extracting all fragments, reinspection revealed no further  fragments present.  I therefore removed the rigid nephroscope.   The flexible cystoscope was then passed, and I was able to visualize the  renal pelvis and on down the ureter, I saw no fragments passing down the  ureter.  I attempted to negotiate the scope into the lower pole where a  single solitary stone was known to be located by previous CT scan;  however, I was unable to negotiate the scope into this location to  identify and extract the stone.  I therefore removed the cystoscope, and  the Amplatz sheath was removed as  well.  Over the guidewire, the council  tip catheter was then passed.  I cut the tip off the council tip  catheter so it would not point beyond the area of the renal pelvis and  inflated the balloon with approximately 2 mL of half-strength contrast.  A 5-French stent was then passed over the guidewire and, through the  council tip catheter, and partially down the left ureter.  I then sewed  the catheter, serving as a nephrostomy tube, in place with a single 2-0  silk suture.  I then connected this to closed system drainage.  Sterile  4x4s and a Tegaderm dressing were applied.  The patient was awakened and  taken to the recovery room in stable satisfactory condition having  tolerated the procedure well with no intraoperative complications.   She will be observed overnight with hopes of removing her nephrostomy  tube in the morning.      Mark C. Vernie Ammons, M.D.   Electronically Signed     MCO/MEDQ  D:  05/02/2006  T:  05/02/2006  Job:  [id number]

## 2010-09-23 NOTE — Op Note (Signed)
NAME:  [name] [name], [name] [name]               ACCOUNT NO.:  [id number]   MEDICAL RECORD NO.:  [id number]          PATIENT TYPE:  AMB   LOCATION:  [facility]                         FACILITY:  [facility]   PHYSICIAN:  Mark C. Vernie Ammons, M.D.  DATE OF BIRTH:  1965/06/27   DATE OF PROCEDURE:  02/17/2005  DATE OF DISCHARGE:                                 OPERATIVE REPORT   PREOPERATIVE DIAGNOSES:  Right flank pain and hydronephrosis.   POSTOPERATIVE DIAGNOSES:  Right flank pain and hydronephrosis.   PROCEDURE:  Cystoscopy, right retrograde pyelogram with interpretation,  right double-J stent placement.   SURGEON:  Mark C. Vernie Ammons, M.D.   ANESTHESIA:  General.   BLOOD LOSS:  None.   DRAINS:  6.4 French 24 cm double-J stent with string.   SPECIMENS:  None.   COMPLICATIONS:  None.   INDICATIONS:  The patient is a 45 year old white female who has cystinuria.  She had a right renal calculus seen on CT scan and underwent ureteroscopy,  however this did not reveal the stone. It was a gentle diagnostic  ureteroscopy and no stent was left although she has had continued  discomfort. 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 She continues to significant pain on  the right-hand side and is brought to the OR today for stent placement and  retrograde with possible ureteroscopy if I see a filling defect.   DESCRIPTION OF OPERATION:  After informed consent, the patient was brought  to the major OR, placed on the table, administered general anesthesia and  then moved to the dorsal lithotomy position. The genitalia was sterilely  prepped and draped. A 21-French cystoscope with 12 degree lens was inserted  in the bladder. The ureteral orifices appeared normal. The right appeared to  have possibly some swelling just at the intramural or ureteral orificial  region. An open-ended  ureteral catheter was then placed just inside the  right ureteral orifice and a retrograde pyelogram was performed. There  appeared to be minimal dilatation of the ureter throughout its course, no  filling defects were noted. The intrarenal collecting system was noted to be  normal. The calices were sharp and there were no filling defects in that  region either. Contrast was injected into the renal pelvis region and filled  that. I then removed the 6-French open-end ureteral catheter and noted  contrast flowing down the ureter unimpeded; however, I dilated the distal  ureteral orifice somewhat with the stent. Because of the continued pain, I  elected to place a double-J stent. A 0.035 inch floppy-tip guidewire was  then passed through an open-ended ureteral catheter into the renal pelvis.  The open-ended catheter was removed and the double-J stent was then passed  over the guidewire into the  renal pelvis. The guidewire was removed and good curl was noted in the area  of the renal pelvis in the bladder. The string was left affixed to the stent  and the patient has removed several  stents previously. She will remove this  one at home on Tuesday. I gave her a prescription for Pyridium Plus #38 and  to contact me if she has any difficulty.      Mark C. Vernie Ammons, M.D.  Electronically Signed     MCO/MEDQ  D:  02/17/2005  T:  02/17/2005  Job:  [id number]

## 2010-09-23 NOTE — Op Note (Signed)
NAME:  [name] [name], [name] [name]                         ACCOUNT NO.:  [id number]   MEDICAL RECORD NO.:  [id number]                   PATIENT TYPE:  AMB   LOCATION:  DAY                                  FACILITY:  [facility]   PHYSICIAN:  Mark C. Vernie Ammons, M.D.               DATE OF BIRTH:  24-Jun-1965   DATE OF PROCEDURE:  05/30/2002  DATE OF DISCHARGE:                                 OPERATIVE REPORT   PREOPERATIVE DIAGNOSIS:  Left renal calculus.   POSTOPERATIVE DIAGNOSIS:  Left renal calculus.   PROCEDURES:  1. Dilation of left percutaneous nephrostomy tube tract.  2. Left percutaneous nephrostolithotomy.   SURGEON:  Mark C. Vernie Ammons, M.D.   ASSISTANTS:  1. Jodi Marble. Fredia Sorrow, M.D.  2. Crecencio Mc, M.D.   ANESTHESIA:  General.   COMPLICATIONS:  None.   INDICATION:  Ms. [name] [name] is a 45 year old female with homozygous cystinuria  and a history of nephrolithiasis secondary to this condition.  She has had  multiple stone procedures in the past and recently has been followed for a  left renal calculus.  She was most recently seen and on radiologic imaging,  her left renal calculus was noted to be enlarging.  Specifically, it had  enlarged from approximately 2.5 cm to approximately 3.5 cm over the past  year.  The patient was also having intermittent left-sided flank pain.  After discussing the options with the patient, she elected to proceed with  surgical treatment.  After discussing the various ways to approach this  stone, it was decided that the best approach would be percutaneously.  Potential risks and benefits of a percutaneous nephrostolithotomy were  discussed with the patient, and she consented.   DESCRIPTION OF PROCEDURE:  The patient was taken to the operating room, and  a general anesthetic was administered.  The patient was administered  preoperative antibiotics, placed in the prone position, and her left flank  was prepped and draped in the usual sterile fashion.  Next,  Sherrine Maples T.  Fredia Sorrow, M.D., of interventional radiology, performed balloon dilation of  her nephrostomy tube.  A guidewire was passed down the indwelling  angiographic catheter which had been placed during her access procedure  earlier that day.  This was placed down the angiograph catheter and into the  bladder under fluoroscopic guidance.  He performed balloon dilation of the  nephrostomy tube tract.  This will be dictated in a separate note.  Once  access was obtained and the nephrostomy tube sheath was in the renal pelvis,  the rigid nephroscope was inserted through the sheath into the renal pelvis,  and the pelvis was examined.  The large renal calculus was immediately seen,  and ultrasonic lithotripsy was then performed to fragment the stone into  approximately three different fragments.  These were then sequentially  pulled out using the three-prong graspers.  A fourth piece was seen down  near the UPJ, and  this also was removed with the three-prong graspers.  Repeat fluoroscopy demonstrated what appeared to be a small fragment in a  calix just below the sheath.  The access was obtained through the lower  pole, and this did appear to be a lower pole calix.  After multiple attempts  to visualize this stone with the flexible cystoscope, it was decided to  inject a small amount of contrast.  This was done, and the stone was seen to  be sitting in a lower pole calix with a stenotic infundibulum.  The flexible  ureteroscope was then inserted into the pelvis, and this area was  identified, and the ureteroscope was able to be manipulated down into this  calix, and the stone fragment was identified.  A nitinol basket was then  passed through the scope, and the stone was able to be grasped and removed.  Reexamination of the calices demonstrated no further significant stone  fragments.  At this point, it was decided to end the procedure.  A 20 French  Council-tip catheter was then passed over  the wire into the renal pelvis and  under fluoroscopic guidance, the balloon was inflated with 2 mL of diluted  contrast.  The sheath was then cut away from the Council-tip catheter, and  the guidewire was removed.  It was also decided to remove the safety wire as  very little bleeding had been encountered.  The nephrostomy tube was  additionally fastened to the skin with a permanent silk suture,and a  dressing was placed.  There were no complications, and the patient appeared  to tolerate the procedure well.  She was able to be extubated and  transferred to the recovery unit in satisfactory condition.  Please note  that Mark C. Vernie Ammons, M.D. was the operating surgeon and was present and  participated in this entire procedure.     Crecencio Mc, M.D.                          Veverly Fells. Vernie Ammons, M.D.    LB/MEDQ  D:  05/30/2002  T:  05/30/2002  Job:  [id number]

## 2010-09-23 NOTE — Op Note (Signed)
[facility]  Patient:    [name] [name], [name] [name] Visit Number: [id number] MRN: [id number]          Service Type: DSU Location: DAY Attending Physician:  Trisha Mangle Dictated by:   Veverly Fells Vernie Ammons, M.D. Proc. Date: 03/18/01 Admit Date:  03/18/2001                             Operative Report  PREOPERATIVE DIAGNOSES:  Right flank pain and history of cystine stones.  POSTOPERATIVE DIAGNOSES:  Right flank pain and history of cystine stones.  PROCEDURE:  Cystoscopy, right retrograde pyelogram with interpretation and right ureteroscopy.  SURGEON:  Mark C. Vernie Ammons, M.D.  ANESTHESIA:  General LMA.  DRAINS:  None.  ESTIMATED BLOOD LOSS:  None.  SPECIMENS:  None.  COMPLICATIONS:  None.  INDICATIONS:  The patient is a 45 year old white female with congenital cystinuria ______ .  She passed multiple stones in the past and found to have hydronephrosis on the right hand side, with what was felt to be an obstructing distal ureteral stone.  Despite conservative therapy, the patient continues to have pain.  She returned to my office with a CT scan last week, and the scan showed signs of a possible stone at the right colic rim region; but, I could not definitively see the stone on her CT scan.  There was a slight increased density in that region, with some edema around the ureter.  She said she had a rough weekend with flank pain over the weekend, and has no significant stones in the kidney by CT.  She is therefore brought to the OR today for ureteroscopy and stone extraction.  The risks, complications and alternatives have been discussed.  DESCRIPTION OF PROCEDURE:  After informed consent, the patient was brought to the OR and placed on the table, administered general anesthesia and moved to the dorsal lithotomy position.  Genitalia was sterilely prepped and draped and a 6-French short rigid ureteroscope was then introduced per urethra, and the right  ureteral orifice was identified.  The ureteroscope was easily passed into the orifice and I initially performed a retrograde pyelogram.  Under fluoroscopy I could not see a definite filling defect within the ureter, consistent with a stone.  I therefore advanced the scope under direct visualization all the way up the ureter, finding no stone within the ureter. I therefore reassessed the ureter by direct vision as I removed the ureteroscope; again, noting no stone.  I then removed the ureteroscope and performed cystoscopy with the cystoscope; noted some debris all sub millimeter in size in the bladder, consistent with possible cystine stone crystals.  No other tumor stones or inflammatory lesions were identified.  At this point, due to the atraumatic nature of the procedure, I did not leave a double-J stent.  The patient was therefore awakened and taken to the recovery room in stable, satisfactory condition.  She tolerated the procedure well with no intraoperative complications.  I will have her contact me next week to give me a progress report.Dictated by:   Veverly Fells Vernie Ammons, M.D.  Attending Physician:  Trisha Mangle DD:  03/18/01 TD:  03/19/01 Job: [id number]

## 2010-11-08 ENCOUNTER — Ambulatory Visit
Admission: RE | Admit: 2010-11-08 | Discharge: 2010-11-08 | Disposition: A | Discharge status: Home / Self Care | Payer: BLUE CROSS/BLUE SHIELD | Source: Ambulatory Visit | Attending: Obstetrics & Gynecology | Admitting: Obstetrics & Gynecology

## 2010-11-08 ENCOUNTER — Other Ambulatory Visit: Payer: Self-pay | Admitting: Obstetrics & Gynecology

## 2010-11-08 DIAGNOSIS — E041 Nontoxic single thyroid nodule: Secondary | ICD-10-CM

## 2010-11-28 ENCOUNTER — Ambulatory Visit (INDEPENDENT_AMBULATORY_CARE_PROVIDER_SITE_OTHER): Payer: PRIVATE HEALTH INSURANCE | Admitting: General Surgery

## 2010-11-28 ENCOUNTER — Encounter (INDEPENDENT_AMBULATORY_CARE_PROVIDER_SITE_OTHER): Payer: Self-pay | Admitting: General Surgery

## 2010-11-28 VITALS — BP 116/68 | HR 64 | Temp 97.4°F | Ht 64.0 in

## 2010-11-28 DIAGNOSIS — E042 Nontoxic multinodular goiter: Secondary | ICD-10-CM

## 2010-11-28 NOTE — Progress Notes (Signed)
[name] [name] is a 45 y.o. female.    Chief Complaint  Patient presents with  . Thyroid Nodule    multi-nodular goiter    HPI HPI This patient is referred by Dr. Aldona Bar for evaluation of a multinodular goiter. She has a family history of goiter in her family with both her mother and grandmother requiring surgery. She first noted a visible nodule in her left neck while driving approximately 1 month ago.  She was looking in the review near she noticed some swelling in her left neck. Subsequently this was confirmed with palpation.  Denies any enlargement or change in the nodule since then.  She had an ultrasound of the thyroid on July 3 which demonstrated multinodular thyroid with a dominant 2 cm nodule in the left lobe of the thyroid. When asked about her symptoms, she does note some "scratchiness" in the throat and some hoarseness after singing over the last year. 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 She also admits to some tachycardia which has been present since age 45. She is currently on metoprolol for this as well as for blood pressure control. She states that she has some occasional palpitations or "butterfly" in her chest and is followed by Dr. Jens Som her cardiologist. When asked about other endocrine problems she denies any other pancreas or parathyroid problems except for her great-grandmother who died of pancreatic cancer. She does have a history of kidney stones and has multiple percutaneous nephrostomies for this. 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 Other abdominal pains or psychiatric disorders.  Allergies  Allergen Reactions  . Morphine     PMH: HTN cysteinuria-kidney problems Tachycardia Scarlet fever   PSH: Multiple kidney scopes and perc. 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 Morphine     No current outpatient prescriptions on file.    Review of Systems Review of Systems  All other systems reviewed and are negative.  Except as in HPI  Physical Exam Physical Exam  Constitutional: She is oriented to person, place, and time. She appears well-developed and well-nourished. No distress.  HENT:  Head: Normocephalic and atraumatic.  Mouth/Throat: No oropharyngeal exudate.  Eyes: Conjunctivae and EOM are normal. Pupils are equal, round, and reactive to light. Right eye exhibits no discharge. Left eye exhibits no discharge. No scleral icterus.  Neck: Normal range of motion. Neck supple. No JVD present. No tracheal deviation present. Thyromegaly present.       Enlarged thyroid gland with palpable nodules bilaterally, largest on the left which is slightly visible.  No LAD noted.   Cardiovascular: Normal rate, regular rhythm and normal heart sounds.   Respiratory: Effort normal and breath sounds normal. No stridor. No respiratory distress. She has no wheezes. She has no rales. She exhibits no tenderness.  GI: Soft. Bowel sounds are normal. She exhibits no distension and no mass. There is no tenderness. There is no rebound and no guarding.  Musculoskeletal: Normal range of motion. She exhibits no edema and no tenderness.       Right forearm prosthesis  Lymphadenopathy:    She has no cervical adenopathy.  Neurological: She is alert and oriented to person, place, and time. She has normal reflexes.  Skin: Skin is warm  and dry. No rash noted. She is not diaphoretic. No erythema. No pallor.  Psychiatric: She has a normal mood and affect. Her behavior is normal. Judgment and thought content normal.     Blood pressure 116/68, pulse 64, temperature 97.4 F (36.3 C), temperature source Temporal, height 5\' 4"  (1.626 m).  Assessment/Plan Multinodular Goiter  This is most  likely a nontoxic multinodular goiter with a left dominant nodule. I discussed with her the natural history of multinodular goiter and thyroid nodules in general.  I explained that most commonly these are of a benign nature but can rarely harbor malignancy especially in the presence of a dominant nodule or enlarging nodule. I recommended ultrasound-guided FNA of the dominant left nodule for further evaluation. We will also check her thyroid function and parathyroid hormone given the fact that she has had multiple kidney problems and problems for what sounds like kidney stones in the past. I recommended that she followup with me in approximately 2 weeks after her laboratory studies and her FNA was performed. If FNA is benign, I would recommend followup ultrasound in approximately 6 months.     Lodema Pilot DAVID 11/28/2010, 2:00 PM

## 2010-11-29 ENCOUNTER — Other Ambulatory Visit (HOSPITAL_COMMUNITY)
Admission: RE | Admit: 2010-11-29 | Discharge: 2010-11-29 | Disposition: A | Discharge status: Home / Self Care | Payer: PRIVATE HEALTH INSURANCE | Source: Ambulatory Visit | Attending: Interventional Radiology | Admitting: Interventional Radiology

## 2010-11-29 ENCOUNTER — Ambulatory Visit
Admission: RE | Admit: 2010-11-29 | Discharge: 2010-11-29 | Disposition: A | Discharge status: Home / Self Care | Payer: PRIVATE HEALTH INSURANCE | Source: Ambulatory Visit | Attending: General Surgery | Admitting: General Surgery

## 2010-11-29 DIAGNOSIS — E049 Nontoxic goiter, unspecified: Secondary | ICD-10-CM | POA: Insufficient documentation

## 2010-11-29 DIAGNOSIS — E042 Nontoxic multinodular goiter: Secondary | ICD-10-CM

## 2010-11-29 LAB — BASIC METABOLIC PANEL
BUN: 16 mg/dL (ref 6–23)
Chloride: 102 mEq/L (ref 96–112)
Glucose, Bld: 87 mg/dL (ref 70–99)
Potassium: 4.5 mEq/L (ref 3.5–5.3)
Sodium: 140 mEq/L (ref 135–145)

## 2010-11-29 LAB — TSH: TSH: 0.975 u[IU]/mL (ref 0.350–4.500)

## 2010-11-29 LAB — PTH, INTACT AND CALCIUM: PTH: 58.7 pg/mL (ref 14.0–72.0)

## 2010-12-15 ENCOUNTER — Ambulatory Visit (INDEPENDENT_AMBULATORY_CARE_PROVIDER_SITE_OTHER): Payer: PRIVATE HEALTH INSURANCE | Admitting: General Surgery

## 2010-12-15 DIAGNOSIS — E042 Nontoxic multinodular goiter: Secondary | ICD-10-CM

## 2010-12-15 NOTE — Progress Notes (Signed)
Subjective:     Patient ID: [name] [name], female   DOB: [birth year], 45 y.o.   MRN: [id number]  HPI Here for follow up of thyroid FNA for multinodular goiter with left dominant nodule.  Path was benign goiter, no evidence of malignancy. She is doing well and her thyroid function labs were normal as well. She does complain of some hoarseness after singing which she may attribute to the thyroid goiter and nodule she also complains of occasional scratchiness in her throat but otherwise remains asymptomatic. Review of Systems     Objective:   Physical Exam Her exam remains unchanged. She has a slightly visible left thyroid nodule which is palpable on exam and had thyroid is not significantly enlarged.  Assessment:     Multinodular thyroid with dominant nodule     Plan:     Pathology of her left thyroid nodule biopsy was benign. She is relatively asymptomatic from this nontoxic multinodular goiter and thyroid function tests were normal. We discussed the options of thyroidectomy with its benefits and its risks versus continue to follow up with six-month ultrasound and possible biopsy at that time if any changes. As this is very low likelihood of malignancy within the multinodular goiter think that surveillance ultrasound is probably reasonable since she is relatively asymptomatic. She would like to pursue this option and in 6 months if any changes we will consider rebiopsy or if this is becoming increasingly symptomatic we will consider again thyroidectomy at that time.

## 2011-01-30 ENCOUNTER — Other Ambulatory Visit: Payer: Self-pay | Admitting: *Deleted

## 2011-01-30 MED ORDER — METOPROLOL SUCCINATE ER 50 MG PO TB24: 50.0000 mg | ORAL_TABLET | Freq: Every day | ORAL | Status: DC

## 2011-02-06 LAB — POCT HEMOGLOBIN-HEMACUE: Hemoglobin: 17 — ABNORMAL HIGH

## 2011-06-08 ENCOUNTER — Ambulatory Visit
Admission: RE | Admit: 2011-06-08 | Discharge: 2011-06-08 | Disposition: A | Discharge status: Home / Self Care | Payer: PRIVATE HEALTH INSURANCE | Source: Ambulatory Visit | Attending: General Surgery | Admitting: General Surgery

## 2011-06-08 DIAGNOSIS — E042 Nontoxic multinodular goiter: Secondary | ICD-10-CM

## 2011-06-15 ENCOUNTER — Ambulatory Visit (INDEPENDENT_AMBULATORY_CARE_PROVIDER_SITE_OTHER): Payer: PRIVATE HEALTH INSURANCE | Admitting: General Surgery

## 2011-06-15 ENCOUNTER — Encounter (INDEPENDENT_AMBULATORY_CARE_PROVIDER_SITE_OTHER): Payer: Self-pay | Admitting: General Surgery

## 2011-06-15 VITALS — BP 108/72 | HR 68 | Temp 97.2°F | Resp 18 | Ht 64.0 in | Wt 140.4 lb

## 2011-06-15 DIAGNOSIS — E042 Nontoxic multinodular goiter: Secondary | ICD-10-CM

## 2011-06-15 NOTE — Progress Notes (Signed)
Subjective:     Patient ID: [name] [name], female   DOB: 17-Nov-1965, 46 y.o.   MRN: [id number]  HPI Patient follows up for evaluation of a nontoxic nodular goiter with a left dominant nodule. She had a repeat ultrasound on January 31 which demonstrated stability of the left thyroid nodules without any increase in size and actually slight decrease. She states that she is asymptomatic and very rarely has some scratchiness in her throat after singing but really denies any significant problems. She does have some tachycardia for which she takes metoprolol.  Review of Systems     Objective:   Physical Exam NAD, nontoxic I do not appreciate any thyroid nodules on exam and she has no lymphadenopathy in her neck.     Assessment:     Nontoxic multinodular goiter with a dominant left nodule She is asymptomatic and she has shown stability of this nodule. Previous biopsy was benign and she really is not interested in surgery at this time. I think that given the stability of the nodule in her asymptomatic nature with a benign biopsy of think it's reasonable to not operate for this. Followup ultrasound was recommended by radiology and we will applied with the six-month followup ultrasound. If it again show stability and she remains asymptomatic, then I think that this should be adequate to establish benign nature.    Plan:     She will follow up in 6 months with f/u US of the thyroid or sooner PRN

## 2011-06-20 ENCOUNTER — Other Ambulatory Visit: Payer: Self-pay | Admitting: Obstetrics & Gynecology

## 2011-06-20 DIAGNOSIS — Z1239 Encounter for other screening for malignant neoplasm of breast: Secondary | ICD-10-CM

## 2011-08-29 ENCOUNTER — Ambulatory Visit
Admission: RE | Admit: 2011-08-29 | Discharge: 2011-08-29 | Disposition: A | Discharge status: Home / Self Care | Payer: BLUE CROSS/BLUE SHIELD | Source: Ambulatory Visit | Attending: Obstetrics & Gynecology | Admitting: Obstetrics & Gynecology

## 2011-08-29 DIAGNOSIS — Z1239 Encounter for other screening for malignant neoplasm of breast: Secondary | ICD-10-CM

## 2011-12-06 ENCOUNTER — Ambulatory Visit (INDEPENDENT_AMBULATORY_CARE_PROVIDER_SITE_OTHER)
Admission: RE | Admit: 2011-12-06 | Discharge: 2011-12-06 | Payer: BLUE CROSS/BLUE SHIELD | Source: Ambulatory Visit | Attending: General Surgery | Admitting: General Surgery

## 2011-12-06 DIAGNOSIS — E042 Nontoxic multinodular goiter: Secondary | ICD-10-CM

## 2011-12-14 ENCOUNTER — Ambulatory Visit (INDEPENDENT_AMBULATORY_CARE_PROVIDER_SITE_OTHER): Payer: BLUE CROSS/BLUE SHIELD | Admitting: General Surgery

## 2011-12-14 ENCOUNTER — Encounter (INDEPENDENT_AMBULATORY_CARE_PROVIDER_SITE_OTHER): Payer: Self-pay | Admitting: General Surgery

## 2011-12-14 VITALS — BP 106/78 | HR 68 | Temp 97.2°F | Resp 16 | Ht 64.0 in | Wt 145.6 lb

## 2011-12-14 DIAGNOSIS — E042 Nontoxic multinodular goiter: Secondary | ICD-10-CM

## 2011-12-14 NOTE — Progress Notes (Signed)
Subjective:     Patient ID: [name] [name], female   DOB: 02/21/1966, 46 y.o.   MRN: [id number]  HPI This patient follows up status post ultrasound of her thyroid to perform surveillance of a nontoxic multinodular goiter with a left dominant nodule. She had an ultrasound in January and repeat ultrasound in July and this demonstrated stability of the goiter as well as the dominant nodule. There has been no increase in size or characteristics of the dominant nodule.  she is essentially asymptomatic from this as well. She denies any dysphasia and or hoarseness or discomfort or swallowing problems.  Review of Systems     Objective:   Physical Exam No acute distress and nontoxic-appearing She has some very mild swelling of the left neck in the thyroid region but I do not appreciate any specific nodule or lymphadenopathy. There is no lymphadenopathy bilaterally. This is nontender.    Assessment:     Nontoxic multinodular goiter with a dominant left nodule She is asymptomatic from this goiter and she has a followup ultrasound demonstrating stability of this left-sided nodule. She has had a prior FNA which was benign as well. Given her benign biopsied and stability of her nodules and asymptomatic nature, of which discussed the options for continued observation versus surgery and she is interested in continued observation and nonoperative management. I explained that I cannot fully exclude malignancy without excision but I think that the likelihood of malignancy in this multinodular goiter with a stable nodule less than 2 cm  is low.     Plan:     As long as she remains asymptomatic and does not notice any change in the size of her nodules or goiter, we will plan on seeing her back in one year with a followup ultrasound and exam.

## 2012-02-23 ENCOUNTER — Other Ambulatory Visit: Payer: Self-pay | Admitting: Cardiology

## 2012-03-15 ENCOUNTER — Encounter: Payer: Self-pay | Admitting: *Deleted

## 2012-03-15 ENCOUNTER — Encounter: Payer: Self-pay | Admitting: Cardiology

## 2012-03-25 ENCOUNTER — Ambulatory Visit (INDEPENDENT_AMBULATORY_CARE_PROVIDER_SITE_OTHER): Payer: BC Managed Care – PPO | Admitting: Cardiology

## 2012-03-25 ENCOUNTER — Encounter: Payer: Self-pay | Admitting: Cardiology

## 2012-03-25 VITALS — BP 106/68 | HR 64 | Ht 64.0 in | Wt 152.0 lb

## 2012-03-25 DIAGNOSIS — I471 Supraventricular tachycardia: Secondary | ICD-10-CM

## 2012-03-25 DIAGNOSIS — I10 Essential (primary) hypertension: Secondary | ICD-10-CM

## 2012-03-25 DIAGNOSIS — I498 Other specified cardiac arrhythmias: Secondary | ICD-10-CM

## 2012-03-25 MED ORDER — METOPROLOL SUCCINATE ER 50 MG PO TB24: 50.0000 mg | ORAL_TABLET | Freq: Every day | ORAL | Status: DC

## 2012-03-25 NOTE — Assessment & Plan Note (Signed)
Patient symptoms are controlled. Continue Toprol. Would consider referral to EP for ablation if symptoms worsen in the future. No recent episodes. 

## 2012-03-25 NOTE — Patient Instructions (Addendum)
Your physician recommends that you schedule a follow-up appointment as needed  

## 2012-03-25 NOTE — Assessment & Plan Note (Signed)
Blood pressure controlled.continue present medications. 

## 2012-03-25 NOTE — Progress Notes (Signed)
   HPI: Ms. Schippers is an extremely pleasant female who has a history of hypertension, SVT, and cystinuria. According to previous notes when she was cared for by Dr Corinda Gubler, she has a history of SVT although I do not have actual strips. A previous echocardiogram in September 2003, showed normal LV function and a Myoview performed inSeptember 2003, showed no scar or ischemia with an ejection fraction 74%. I last saw her in Dec 2010. Since then the patient denies any dyspnea on exertion, orthopnea, PND, pedal edema, palpitations, syncope or chest pain.   Current Outpatient Prescriptions  Medication Sig Dispense Refill  . metoprolol succinate (TOPROL-XL) 50 MG 24 hr tablet TAKE 1 TABLET DAILY  30 tablet  0     Past Medical History  Diagnosis Date  . Thyroid disease   . Presence of left artificial arm   . History of nephrostomy     h/o multiple nephrostomies  . SVT (supraventricular tachycardia)     No past surgical history on file.  History   Social History  . Marital Status: Married    Spouse Name: N/A    Number of Children: N/A  . Years of Education: N/A   Occupational History  . Not on file.   Social History Main Topics  . Smoking status: Never Smoker   . Smokeless tobacco: Never Used  . Alcohol Use: Yes     Comment: rarely  . Drug Use: No  . Sexually Active: Not on file   Other Topics Concern  . Not on file   Social History Narrative  . No narrative on file    ROS: no fevers or chills, productive cough, hemoptysis, dysphasia, odynophagia, melena, hematochezia, dysuria, hematuria, rash, seizure activity, orthopnea, PND, pedal edema, claudication. Remaining systems are negative.  Physical Exam: Well-developed well-nourished in no acute distress.  Skin is warm and dry.  HEENT is normal.  Neck is supple.  Chest is clear to auscultation with normal expansion.  Cardiovascular exam is regular rate and rhythm.  Abdominal exam nontender or distended. No masses  palpated. Extremities show no edema. Right arm prosthesis. neuro grossly intact  ECG sinus rhythm with early repolarization abnormality.

## 2012-03-27 ENCOUNTER — Other Ambulatory Visit: Payer: Self-pay | Admitting: Cardiology

## 2012-07-23 IMAGING — US US SOFT TISSUE HEAD/NECK
1 series · 14 of 25 positions shown · non-contrast
Comparison: Thyroid ultrasound 11/08/2010.

CLINICAL DATA: Follow-up thyroid nodules.

THYROID ULTRASOUND
TECHNIQUE: Ultrasound examination of the thyroid gland and adjacent
soft tissues was performed.

[Series 1: us soft tissue head/neck · 0.07mm/px · 14 of 74 slices shown]
[im 1/74]
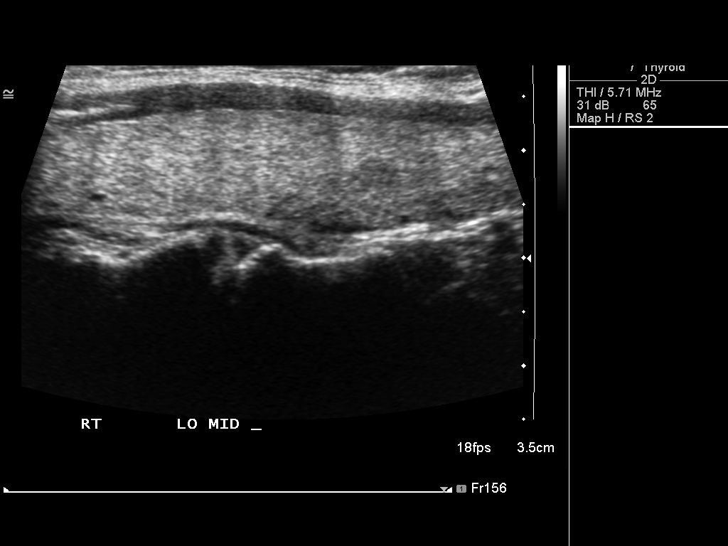
[im 7/74]
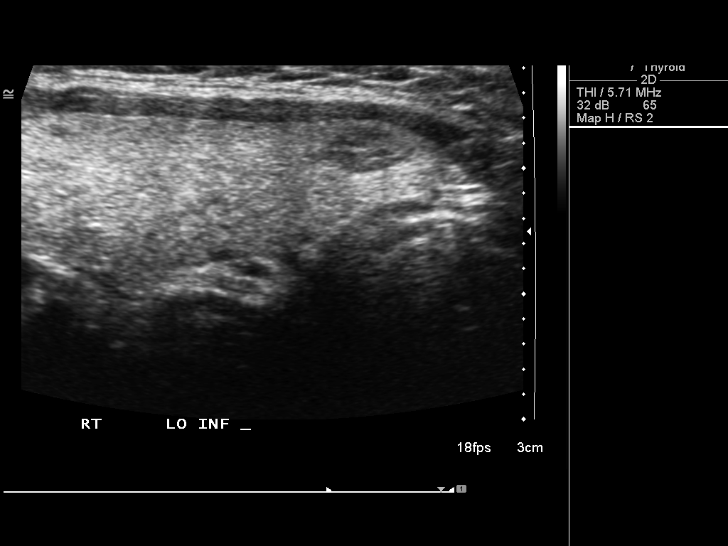
[im 13/74]
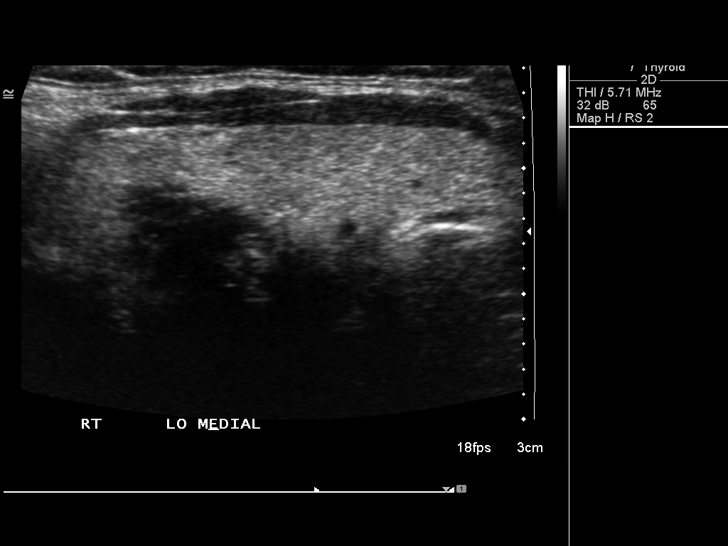
[im 19/74]
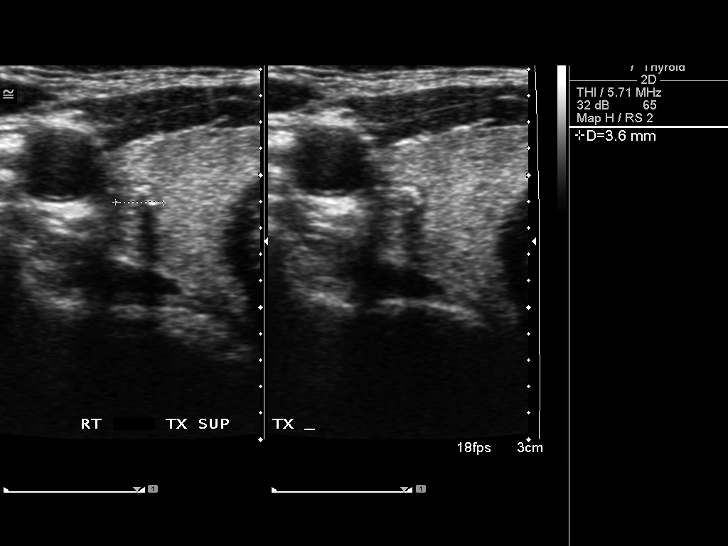
[im 25/74]
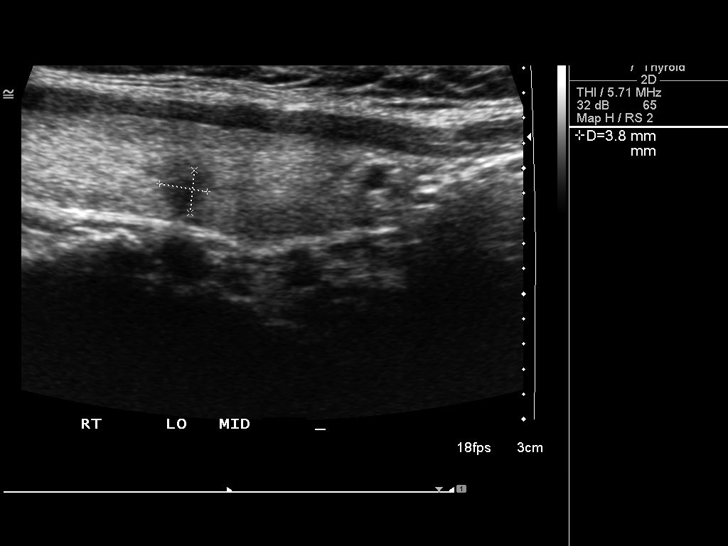
[im 28/74]
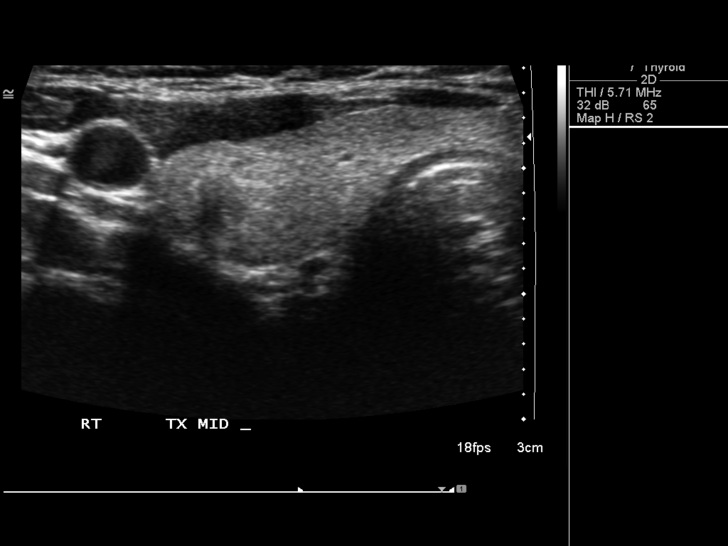
[im 34/74]
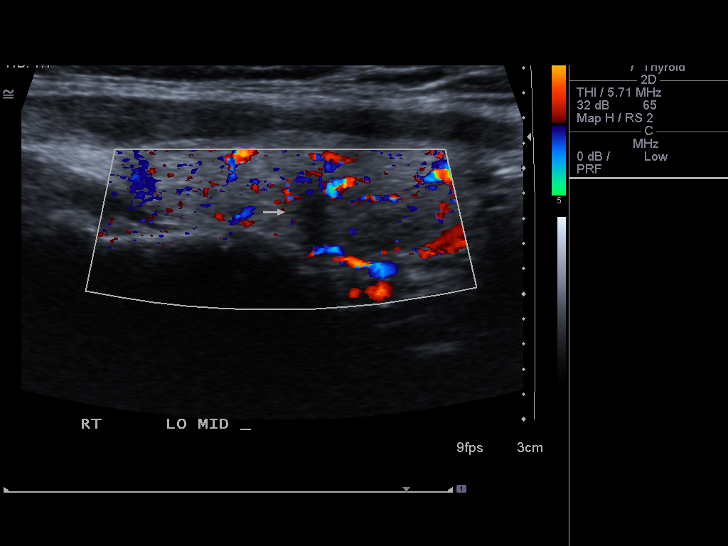
[im 40/74]
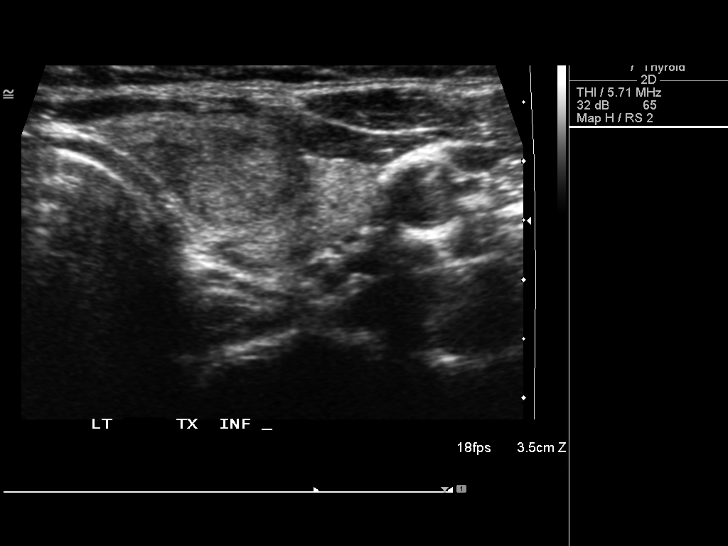
[im 46/74]
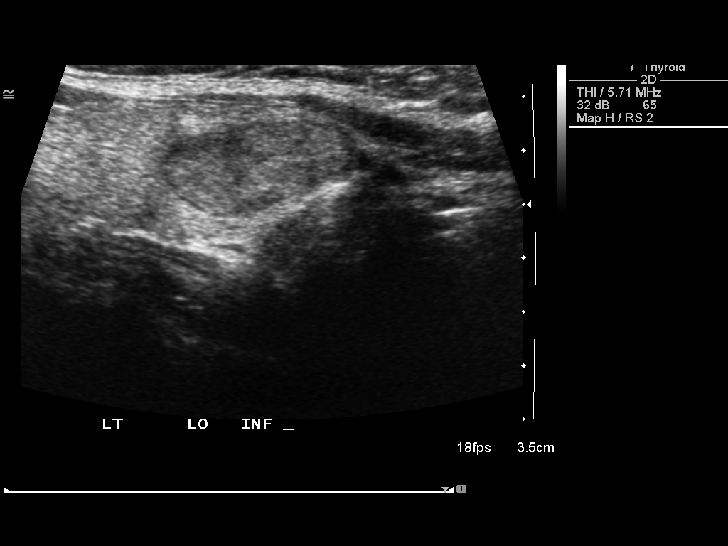
[im 49/74]
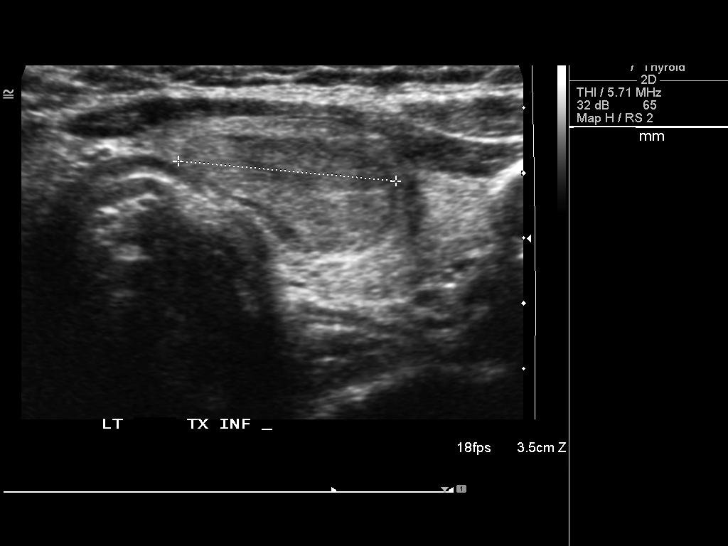
[im 55/74]
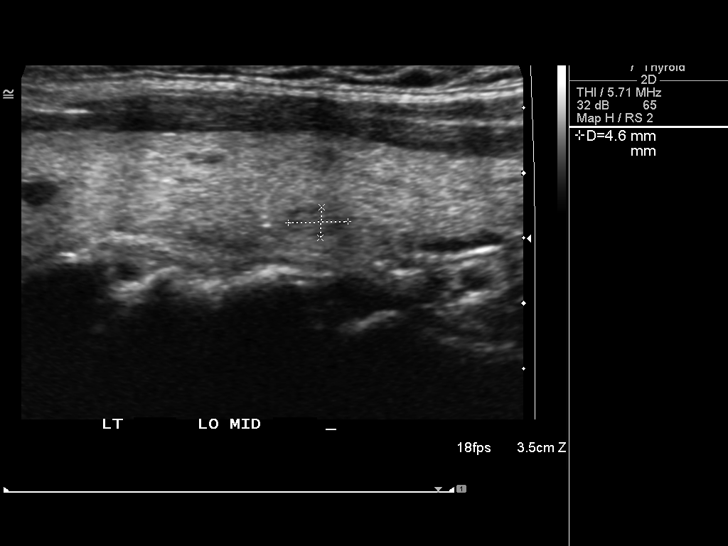
[im 61/74]
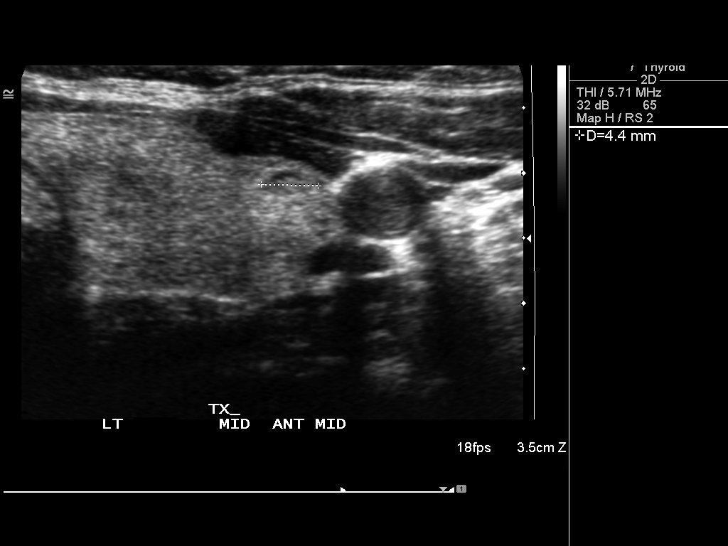
[im 67/74]
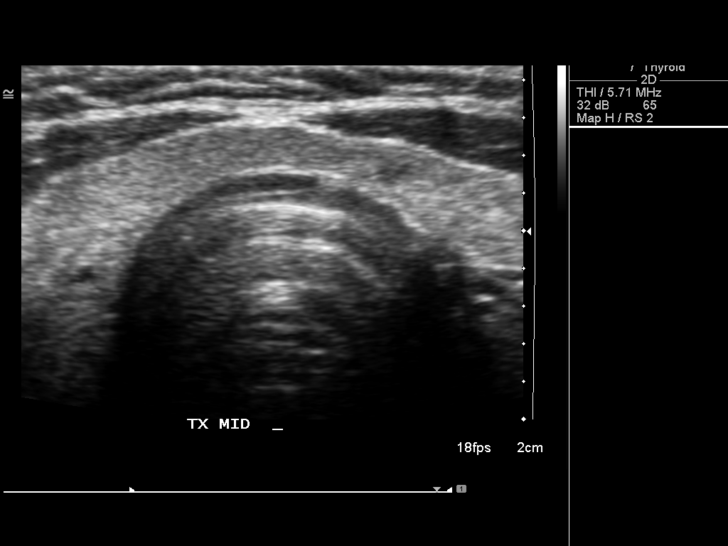
[im 74/74]
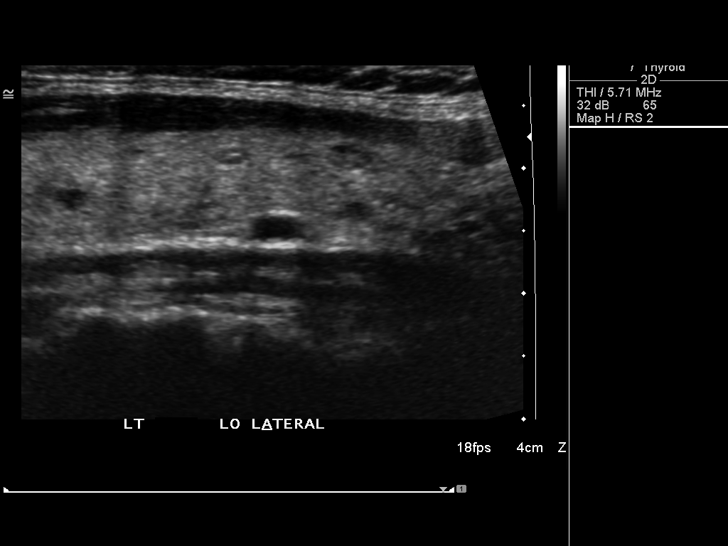

[14 of 25 positions shown; findings below may reference images not displayed]

FINDINGS: Right thyroid lobe:  4.6 x 1.1 x 1.8 cm.
Left thyroid lobe:  4.6 x 1.3 x 2.0 cm.
Isthmus:  0.35 cm in thickness.

Focal nodules:  The thyroid tissue is mildly heterogeneous in
echotexture.  Multiple small predominately solid nodules are again
demonstrated bilaterally.  The largest nodule on the right measures
8 x 4 x 8 mm.  This is unchanged.  On the left, the largest solid
nodule is located inferiorly and measures 1.9 x 0.8 x 1.7 cm.  This
previously measured 2.0 x 1.0 x 1.8 cm.  This nodule appears
homogeneous and mildly hypoechoic without definite
microcalcifications.  No enlarging nodules are identified.

Lymphadenopathy:  None visualized.
IMPRESSION: No significant change is seen compared with the prior study of
approximately 7 months ago.  Dominant solid nodule inferiorly in
the left measures slightly smaller. Findings are most consistent
with multinodular goiter.  Continued ultrasound followup is
recommended, however.

## 2012-08-12 ENCOUNTER — Other Ambulatory Visit: Payer: Self-pay

## 2012-08-12 DIAGNOSIS — Z1231 Encounter for screening mammogram for malignant neoplasm of breast: Secondary | ICD-10-CM

## 2012-09-05 ENCOUNTER — Ambulatory Visit
Admission: RE | Admit: 2012-09-05 | Discharge: 2012-09-05 | Disposition: A | Discharge status: Home / Self Care | Payer: BC Managed Care – PPO | Source: Ambulatory Visit

## 2012-09-05 DIAGNOSIS — Z1231 Encounter for screening mammogram for malignant neoplasm of breast: Secondary | ICD-10-CM

## 2012-10-30 ENCOUNTER — Encounter (INDEPENDENT_AMBULATORY_CARE_PROVIDER_SITE_OTHER): Payer: Self-pay | Admitting: General Surgery

## 2012-11-12 ENCOUNTER — Other Ambulatory Visit: Payer: Self-pay | Admitting: Obstetrics & Gynecology

## 2012-12-01 ENCOUNTER — Emergency Department (HOSPITAL_BASED_OUTPATIENT_CLINIC_OR_DEPARTMENT_OTHER): Payer: BC Managed Care – PPO

## 2012-12-01 ENCOUNTER — Encounter (HOSPITAL_BASED_OUTPATIENT_CLINIC_OR_DEPARTMENT_OTHER): Payer: Self-pay

## 2012-12-01 ENCOUNTER — Emergency Department (HOSPITAL_BASED_OUTPATIENT_CLINIC_OR_DEPARTMENT_OTHER)
Admission: EM | Admit: 2012-12-01 | Discharge: 2012-12-01 | Disposition: A | Discharge status: Home / Self Care | Payer: BC Managed Care – PPO | Attending: Emergency Medicine | Admitting: Emergency Medicine

## 2012-12-01 DIAGNOSIS — Z8639 Personal history of other endocrine, nutritional and metabolic disease: Secondary | ICD-10-CM | POA: Insufficient documentation

## 2012-12-01 DIAGNOSIS — Z8679 Personal history of other diseases of the circulatory system: Secondary | ICD-10-CM | POA: Insufficient documentation

## 2012-12-01 DIAGNOSIS — I10 Essential (primary) hypertension: Secondary | ICD-10-CM | POA: Insufficient documentation

## 2012-12-01 DIAGNOSIS — Z87442 Personal history of urinary calculi: Secondary | ICD-10-CM | POA: Insufficient documentation

## 2012-12-01 DIAGNOSIS — Y929 Unspecified place or not applicable: Secondary | ICD-10-CM | POA: Insufficient documentation

## 2012-12-01 DIAGNOSIS — Y939 Activity, unspecified: Secondary | ICD-10-CM | POA: Insufficient documentation

## 2012-12-01 DIAGNOSIS — Z87448 Personal history of other diseases of urinary system: Secondary | ICD-10-CM | POA: Insufficient documentation

## 2012-12-01 DIAGNOSIS — IMO0002 Reserved for concepts with insufficient information to code with codable children: Secondary | ICD-10-CM | POA: Insufficient documentation

## 2012-12-01 DIAGNOSIS — S93602A Unspecified sprain of left foot, initial encounter: Secondary | ICD-10-CM

## 2012-12-01 DIAGNOSIS — S93609A Unspecified sprain of unspecified foot, initial encounter: Secondary | ICD-10-CM | POA: Insufficient documentation

## 2012-12-01 DIAGNOSIS — Z79899 Other long term (current) drug therapy: Secondary | ICD-10-CM | POA: Insufficient documentation

## 2012-12-01 DIAGNOSIS — Z862 Personal history of diseases of the blood and blood-forming organs and certain disorders involving the immune mechanism: Secondary | ICD-10-CM | POA: Insufficient documentation

## 2012-12-01 DIAGNOSIS — W108XXA Fall (on) (from) other stairs and steps, initial encounter: Secondary | ICD-10-CM | POA: Insufficient documentation

## 2012-12-01 HISTORY — DX: Essential (primary) hypertension: I10

## 2012-12-01 NOTE — ED Provider Notes (Signed)
CSN: [id number]     Arrival date & time 12/01/12  8657 History     First MD Initiated Contact with Patient 12/01/12 [phone]     Chief Complaint  Patient presents with  . Foot Injury   (Consider location/radiation/quality/duration/timing/severity/associated sxs/prior Treatment) HPI Patient presents with pain in the left distal medial foot.  Yesterday, she missed a step.  Since that time she has had pain in the aforementioned area.  She had mild fall, with abrasion to the left knee, the patient states currently unremarkable. Since the onset of pain she has had mild relief with ibuprofen.  Pain is worse with extension than flexion. No other significant trauma during the fall, and the patient states that she is generally well. She is in radiology, states that she is concerned about fracture of the first metatarsal or possible phalanx. Past Medical History  Diagnosis Date  . Thyroid disease   . Presence of left artificial arm   . History of nephrostomy     h/o multiple nephrostomies  . SVT (supraventricular tachycardia)   . Hypertension   . Kidney stones    Past Surgical History  Procedure Laterality Date  . Cystoscopy     No family history on file. History  Substance Use Topics  . Smoking status: Never Smoker   . Smokeless tobacco: Never Used  . Alcohol Use: Yes     Comment: rarely   OB History   Grav Para Term Preterm Abortions TAB SAB Ect Mult Living                 Review of Systems  All other systems reviewed and are negative.    Allergies  Demerol; Morphine; and Ciprofloxin hcl  Home Medications   Current Outpatient Rx  Name  Route  Sig  Dispense  Refill  . metoprolol succinate (TOPROL-XL) 50 MG 24 hr tablet   Oral   Take 1 tablet (50 mg total) by mouth daily. Take with or immediately following a meal.   90 tablet   6     Patient needs an office visit before any other ref ...   . metoprolol succinate (TOPROL-XL) 50 MG 24 hr tablet      TAKE 1 TABLET ONCE  A DAY   30 tablet   12    BP 135/85  Pulse 66  Temp(Src) 98.2 F (36.8 C) (Oral)  Resp 20  Ht 5\' 4"  (1.626 m)  Wt 145 lb (65.772 kg)  BMI 24.88 kg/m2  SpO2 100%  LMP 11/07/2012 Physical Exam  Nursing note and vitals reviewed. Constitutional: She is oriented to person, place, and time. She appears well-developed and well-nourished. No distress.  HENT:  Head: Normocephalic and atraumatic.  Eyes: Conjunctivae and EOM are normal.  Cardiovascular: Normal rate, regular rhythm and intact distal pulses.   Pulmonary/Chest: Effort normal. No stridor. No respiratory distress.  Abdominal: She exhibits no distension.  Musculoskeletal: She exhibits no edema.  Right arm amputation. Left knee generally unremarkable, though there is a superficial abrasion on the inferior anterior aspect. The left ankle is unremarkable. There is tenderness to palpation about the left first MTP, though she is neurovascularly intact with appropriate cap refill and pulses.  Neurological: She is alert and oriented to person, place, and time. No cranial nerve deficit.  Skin: Skin is warm and dry.  Psychiatric: She has a normal mood and affect.    ED Course   Procedures (including critical care time)  Labs Reviewed - No data to  display No results found. No diagnosis found. I interpreted the x-ray.  And demonstrated to the patient and her husband.  MDM  Patient presents with pain following a fall yesterday.  On exam she is awake and alert, neurovascularly intact.  X-ray does not demonstrate fracture, but with concern for sprain the patient had a postop shoe placed.  She was discharged in stable condition with orthopedics followup.  Gerhard Munch, MD 12/01/12 [phone]

## 2012-12-01 NOTE — ED Notes (Signed)
Injury to left foot

## 2012-12-01 NOTE — ED Notes (Signed)
MD at bedside. 

## 2012-12-01 NOTE — ED Notes (Signed)
Patient transported to X-ray 

## 2012-12-16 ENCOUNTER — Other Ambulatory Visit: Payer: BC Managed Care – PPO

## 2012-12-23 ENCOUNTER — Ambulatory Visit (INDEPENDENT_AMBULATORY_CARE_PROVIDER_SITE_OTHER): Payer: BC Managed Care – PPO

## 2012-12-23 DIAGNOSIS — E042 Nontoxic multinodular goiter: Secondary | ICD-10-CM

## 2012-12-24 ENCOUNTER — Other Ambulatory Visit: Payer: Self-pay

## 2012-12-25 ENCOUNTER — Ambulatory Visit (INDEPENDENT_AMBULATORY_CARE_PROVIDER_SITE_OTHER): Payer: BC Managed Care – PPO | Admitting: General Surgery

## 2012-12-25 ENCOUNTER — Encounter (INDEPENDENT_AMBULATORY_CARE_PROVIDER_SITE_OTHER): Payer: Self-pay | Admitting: General Surgery

## 2012-12-25 ENCOUNTER — Telehealth (INDEPENDENT_AMBULATORY_CARE_PROVIDER_SITE_OTHER): Payer: Self-pay

## 2012-12-25 VITALS — BP 126/74 | HR 60 | Temp 98.2°F | Resp 14 | Ht 64.0 in | Wt 150.8 lb

## 2012-12-25 DIAGNOSIS — E042 Nontoxic multinodular goiter: Secondary | ICD-10-CM

## 2012-12-25 NOTE — Telephone Encounter (Signed)
Mailed a copy of AVS (unable to print) to patient address:  [address] [place], [place] [address]

## 2012-12-25 NOTE — Progress Notes (Signed)
Subjective:     Patient ID: [name] [name], female   DOB: 1966-01-04, 47 y.o.   MRN: [id number]  HPI This patient is known to me for prior evaluation formultinodular goiter with a dominant left-sided nodule. She had a biopsy previously which was benign and she really doesn't have many symptoms from this. She says that she can feel some pressure when she lays on her side and turns her head to the left but she denies any dysphagia or hoarseness. 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 She had a mother and a sister who had large goiters removed and her mother had complications from the procedure and so she is cautious about this. She comes in today to followup after her ultrasound and she has had minimal change from prior although she has had some slight growth of the dominant nodule  Review of Systems     Objective:   Physical Exam I do not appreciate any specific nodules on either side. She does have a palpable thyroid was some mild fullness bilaterally but this appears fairly symmetric and again I do not feel any specific nodule or lymphadenopathy    Assessment:     Multinodular goiter I think that this is most likely a benign multinodular goiter. Again discussed with her the possibility of malignancy but I think in the setting of multinodular goiter that this is fairly low. She has had several ultrasounds with essentially stability of the nodule although she has had some slight growth. We discussed the options for continued observation versus repeat biopsy versus surgery. She really is not interested in surgery at this point and she would like to just follow this clinically but before we go to just clinical followup, we will plan for repeat biopsies of the dominant nodule and if this remains benign, then I would feel more comfortable with this plan especially since the has had a small amount of growth.      Plan:     We will set her up for thyroid biopsy and she will followup in about 2 weeks after her biopsy. If this is benign then we will plan on clinical followup and surveillance

## 2012-12-31 ENCOUNTER — Ambulatory Visit
Admission: RE | Admit: 2012-12-31 | Discharge: 2012-12-31 | Disposition: A | Discharge status: Home / Self Care | Payer: BC Managed Care – PPO | Source: Ambulatory Visit | Attending: General Surgery | Admitting: General Surgery

## 2012-12-31 ENCOUNTER — Other Ambulatory Visit (HOSPITAL_COMMUNITY)
Admission: RE | Admit: 2012-12-31 | Discharge: 2012-12-31 | Disposition: A | Discharge status: Home / Self Care | Payer: BC Managed Care – PPO | Source: Ambulatory Visit | Attending: Interventional Radiology | Admitting: Interventional Radiology

## 2012-12-31 DIAGNOSIS — E042 Nontoxic multinodular goiter: Secondary | ICD-10-CM

## 2012-12-31 DIAGNOSIS — E049 Nontoxic goiter, unspecified: Secondary | ICD-10-CM | POA: Insufficient documentation

## 2013-01-07 ENCOUNTER — Telehealth (INDEPENDENT_AMBULATORY_CARE_PROVIDER_SITE_OTHER): Payer: Self-pay | Admitting: *Deleted

## 2013-01-07 NOTE — Telephone Encounter (Signed)
Patient called to ask about her pathology report.  I see that it is back but no notes from Dr. Biagio Quint as to his review of the report.

## 2013-01-09 ENCOUNTER — Telehealth (INDEPENDENT_AMBULATORY_CARE_PROVIDER_SITE_OTHER): Payer: Self-pay | Admitting: General Surgery

## 2013-01-09 NOTE — Telephone Encounter (Signed)
bx results reviewed with the patient. She would like to just follow this clinically which I think is okay now given 2 negative biopsies and relative stability on [place].

## 2013-04-18 ENCOUNTER — Other Ambulatory Visit: Payer: Self-pay | Admitting: Cardiology

## 2013-08-28 ENCOUNTER — Other Ambulatory Visit: Payer: Self-pay

## 2013-08-28 DIAGNOSIS — Z1231 Encounter for screening mammogram for malignant neoplasm of breast: Secondary | ICD-10-CM

## 2013-09-15 ENCOUNTER — Encounter: Payer: Self-pay | Admitting: Family Medicine

## 2013-09-15 ENCOUNTER — Ambulatory Visit (INDEPENDENT_AMBULATORY_CARE_PROVIDER_SITE_OTHER): Payer: BC Managed Care – PPO | Admitting: Family Medicine

## 2013-09-15 VITALS — BP 115/75 | HR 69 | Temp 98.7°F | Ht 64.0 in

## 2013-09-15 DIAGNOSIS — R350 Frequency of micturition: Secondary | ICD-10-CM

## 2013-09-15 DIAGNOSIS — R3915 Urgency of urination: Secondary | ICD-10-CM

## 2013-09-15 LAB — POCT URINALYSIS DIPSTICK
Bilirubin, UA: NEGATIVE
Glucose, UA: NEGATIVE
Ketones, UA: NEGATIVE
Nitrite, UA: NEGATIVE
Spec Grav, UA: 1.01
Urobilinogen, UA: NEGATIVE
pH, UA: 6

## 2013-09-15 LAB — POCT UA - MICROSCOPIC ONLY
Casts, Ur, LPF, POC: NEGATIVE
Crystals, Ur, HPF, POC: NEGATIVE
Mucus, UA: NEGATIVE
Yeast, UA: NEGATIVE

## 2013-09-15 MED ORDER — NITROFURANTOIN MONOHYD MACRO 100 MG PO CAPS: 100.0000 mg | ORAL_CAPSULE | Freq: Two times a day (BID) | ORAL | Status: DC

## 2013-09-16 NOTE — Progress Notes (Signed)
   Subjective:    Patient ID: [name] [name], female    DOB: [birth year], 48 y.o.   MRN: [id number]  HPI  This 48 y.o. female presents for evaluation of dysuria and urinary frequency for the last few days.  Review of Systems C/o urinary sx's   No chest pain, SOB, HA, dizziness, vision change, N/V, diarrhea, constipation, myalgias, arthralgias or rash.  Objective:   Physical Exam Vital signs noted  Well developed well nourished female.  HEENT - Head atraumatic Normocephalic                Eyes - PERRLA, Conjuctiva - clear Sclera- Clear EOMI                Ears - EAC's Wnl TM's Wnl Gross Hearing WNL                Throat - oropharanx wnl Respiratory - Lungs CTA bilateral Cardiac - RRR S1 and S2 without murmur GI - Abdomen soft Nontender and bowel sounds active x 4 Extremities - No edema. Neuro - Grossly intact.   Results for orders placed in visit on 09/15/13  POCT UA - MICROSCOPIC ONLY      Result Value Ref Range   WBC, Ur, HPF, POC 20-30     RBC, urine, microscopic 20-40     Bacteria, U Microscopic moderate     Mucus, UA negative     Epithelial cells, urine per micros few     Crystals, Ur, HPF, POC negative     Casts, Ur, LPF, POC negative     Yeast, UA negative    POCT URINALYSIS DIPSTICK      Result Value Ref Range   Color, UA gold     Clarity, UA cloudy     Glucose, UA negative     Bilirubin, UA negative     Ketones, UA negative     Spec Grav, UA 1.010     Blood, UA large     pH, UA 6.0     Protein, UA trace     Urobilinogen, UA negative     Nitrite, UA negative     Leukocytes, UA large (3+)        Assessment & Plan:  Urinary frequency - Plan: POCT UA - Microscopic Only, POCT urinalysis dipstick, Urine culture, nitrofurantoin, macrocrystal-monohydrate, (MACROBID) 100 MG capsule, DISCONTINUED: nitrofurantoin, macrocrystal-monohydrate, (MACROBID) 100 MG capsule  Urinary urgency - Plan: POCT UA - Microscopic Only, POCT urinalysis dipstick, Urine culture,  nitrofurantoin, macrocrystal-monohydrate, (MACROBID) 100 MG capsule, DISCONTINUED: nitrofurantoin, macrocrystal-monohydrate, (MACROBID) 100 MG capsule  [name] CanterWilliam J Abia Monaco FNP

## 2013-09-18 ENCOUNTER — Ambulatory Visit: Payer: BC Managed Care – PPO

## 2013-09-18 LAB — URINE CULTURE

## 2013-09-24 ENCOUNTER — Other Ambulatory Visit: Payer: Self-pay | Admitting: Urology

## 2013-09-24 ENCOUNTER — Encounter (HOSPITAL_BASED_OUTPATIENT_CLINIC_OR_DEPARTMENT_OTHER): Payer: Self-pay | Admitting: *Deleted

## 2013-09-24 NOTE — Progress Notes (Signed)
NPO AFTER MN WITH EXCEPTION CLEAR LIQUIDS UNTIL 0900 (NO CREAM/ MILK PRODUCTS).  ARRIVE AT 1330.  NEEDS ISTAT , EKG, KUB AND URINE PREG. MAY TAKE ONE TYPE PAIN RX IF NEEDED W/ SIPS OF WATER DOS.

## 2013-09-24 NOTE — H&P (Signed)
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  The patient is a long time patient of Dr. Margrett Rudttelin's who has treated her with multimodality therapy including numerous surgeries, captopril, water glutney and medical expulsion therapy for her cystine stones. She has not been seen and followed up in 5 years following a bilateral stone clean out. She has done very well, she has been passing stones on her own in the last 5 years. This past year she notes passing 5 stones are noted. She presents today with 5 days of worsening left flank pain. Over the past 5 days she has had increasing left flank pain as well as progressive lower urinary tract symptoms including frequency and urgency. She denies any dysuria or fevers. She denies any gross hematuria. The patient's pain is 10/10. It is a pressure/stabbing feeling. The patient was seen by her primary care physician and given Macrobid for her lower urinary tract symptoms. They have not improved. She has been able to take Toradol and Dilaudid for her pain which have not made it better. She has not started any Flomax.   Past Medical History Problems  1. History of hypertension (V12.59) 2. History of Murmur (785.2) 3. History of Nephrolithiasis Of Both Kidneys (V13.01)  Surgical History Problems  1. History of Cystoscopy With Insertion Of Ureteral Stent Bilateral 2. History of Cystoscopy With Insertion Of Ureteral Stent Right 3. History of Cystoscopy With Pyeloscopy With Lithotripsy 4. History of Cystoscopy With Pyeloscopy With Removal Of Calculus 5. History of Cystoscopy With Ureteroscopy Right 6. History of Lithotripsy 7. History of Percutaneous Lithotomy 8. History of Percutaneous Lithotomy  Current Meds 1. Metoprolol Succinate ER 50 MG Oral Tablet Extended Release 24 Hour;  Therapy: 12Dec2014 to Recorded 2. Nitrofurantoin Monohyd Macro 100 MG Oral Capsule;  Therapy: 11May2015 to  Recorded  Allergies Medication  1. Demerol TABS 2. Morphine Derivatives  Family History Problems  1. Family history of Death In The Family Father : Father   age 929; car accident 2. Family history of Family Health Status Number Of Children   1 son 3. Family history of Urologic Disorder (V18.7)  Social History Problems    Denied: Alcohol Use   Caffeine Use   Family history of Death In The Family Father   Marital History - Currently Married   Never a smoker   Occupation:   Estate agentite Manager   Denied: Tobacco Use (V15.82)  Review of Systems Genitourinary, constitutional, skin, eye, otolaryngeal, hematologic/lymphatic, cardiovascular, pulmonary, endocrine, musculoskeletal, gastrointestinal, neurological and psychiatric system(s) were reviewed and pertinent findings if present are noted.  Genitourinary: dysuria, hematuria and cloudy urine.  Gastrointestinal: nausea.    Vitals Vital Signs   Height: 5 ft 4 in Weight: 134 lb  BMI Calculated: 23 BSA Calculated: 1.65 Blood Pressure: 149 / 77 Temperature: 97.6 F Heart Rate: 60  Physical Exam Constitutional: Well nourished and well developed . No acute distress.   ENT:. The ears and nose are normal in appearance.   Neck: The appearance of the neck is normal and no neck mass is present.   Pulmonary: No respiratory distress and normal respiratory rhythm and effort.   Cardiovascular:. No obvious murmurs are appreciated.   Abdomen: No suprapubic tenderness. No right CVA tenderness and left CVA tenderness.   Lymphatics: The posterior cervical, anterior cervical and supraclavicular nodes are not enlarged or tender.   Skin: Normal skin turgor, no visible rash and no visible skin lesions.   Neuro/Psych:. Mood and  affect are appropriate.    Results/Data Urine COLOR RED  APPEARANCE CLEAR  SPECIFIC GRAVITY 1.010  pH 7.0  GLUCOSE NEG mg/dL BILIRUBIN NEG  KETONE NEG mg/dL BLOOD LARGE  PROTEIN TRACE  mg/dL UROBILINOGEN 0.2 mg/dL NITRITE NEG  LEUKOCYTE ESTERASE SMALL  SQUAMOUS EPITHELIAL/HPF RARE  WBC NONE SEEN WBC/hpf RBC 21-50 RBC/hpf BACTERIA FEW  CRYSTALS NONE SEEN  CASTS NONE SEEN   The following images/tracing/specimen were independently visualized: Marland Kitchen. A CT scan was performed in our clinic. There is a large partial staghorn in the patient's right kidney with no ureteral stones or hydronephrosis. There are small stones within the lower pole of the left kidney with a large amount of hydronephrosis hydroureteronephrosis leading down to the left UVJ. There she has a 5.5 mm x 10 mm stone.  The following clinical lab reports were reviewed:  The patient's urinalysis today is positive for red blood cells, white blood cells, and a few bacteria. There is no nitrites.    Assessment Obstructing left distal ureteral stone with proximal hydronephrosis.  I contacted her and discussed her CT scan findings with her in detail.  Although she has been placed on medical expulsive therapy she has not passed her stone and would like to have the stone treated.  She is undergone multiple ureteroscopy's in the past however we did discuss the procedure again today detail including its risks and complications, probability of success, outpatient nature of the procedure as well as anticipated postoperative course with the need of a stent.  She understands and is elected to proceed.  Plan She is scheduled for cystoscopy with left retrograde pyelogram and ureteroscopy with laser lithotripsy, stone extraction and stent placement under general anesthesia as an outpatient.

## 2013-09-25 ENCOUNTER — Ambulatory Visit (HOSPITAL_BASED_OUTPATIENT_CLINIC_OR_DEPARTMENT_OTHER): Payer: BC Managed Care – PPO | Admitting: Anesthesiology

## 2013-09-25 ENCOUNTER — Encounter (HOSPITAL_BASED_OUTPATIENT_CLINIC_OR_DEPARTMENT_OTHER): Payer: BC Managed Care – PPO | Admitting: Anesthesiology

## 2013-09-25 ENCOUNTER — Ambulatory Visit (HOSPITAL_BASED_OUTPATIENT_CLINIC_OR_DEPARTMENT_OTHER)
Admission: RE | Admit: 2013-09-25 | Discharge: 2013-09-25 | Disposition: A | Discharge status: Home / Self Care | Payer: BC Managed Care – PPO | Source: Ambulatory Visit | Attending: Urology | Admitting: Urology

## 2013-09-25 ENCOUNTER — Ambulatory Visit (HOSPITAL_COMMUNITY): Payer: BC Managed Care – PPO

## 2013-09-25 ENCOUNTER — Encounter (HOSPITAL_BASED_OUTPATIENT_CLINIC_OR_DEPARTMENT_OTHER)
Admission: RE | Disposition: A | Discharge status: Home / Self Care | Payer: Self-pay | Source: Ambulatory Visit | Attending: Urology

## 2013-09-25 ENCOUNTER — Ambulatory Visit: Payer: BC Managed Care – PPO

## 2013-09-25 ENCOUNTER — Encounter (HOSPITAL_BASED_OUTPATIENT_CLINIC_OR_DEPARTMENT_OTHER): Payer: Self-pay | Admitting: *Deleted

## 2013-09-25 DIAGNOSIS — N201 Calculus of ureter: Secondary | ICD-10-CM | POA: Insufficient documentation

## 2013-09-25 DIAGNOSIS — R011 Cardiac murmur, unspecified: Secondary | ICD-10-CM | POA: Insufficient documentation

## 2013-09-25 DIAGNOSIS — Z79899 Other long term (current) drug therapy: Secondary | ICD-10-CM | POA: Insufficient documentation

## 2013-09-25 DIAGNOSIS — N1330 Unspecified hydronephrosis: Secondary | ICD-10-CM | POA: Insufficient documentation

## 2013-09-25 DIAGNOSIS — Z87442 Personal history of urinary calculi: Secondary | ICD-10-CM | POA: Insufficient documentation

## 2013-09-25 DIAGNOSIS — E7200 Disorders of amino-acid transport, unspecified: Secondary | ICD-10-CM

## 2013-09-25 DIAGNOSIS — I10 Essential (primary) hypertension: Secondary | ICD-10-CM | POA: Insufficient documentation

## 2013-09-25 HISTORY — DX: Supraventricular tachycardia, unspecified: I47.10

## 2013-09-25 HISTORY — PX: HOLMIUM LASER APPLICATION: SHX5852

## 2013-09-25 HISTORY — DX: Nausea with vomiting, unspecified: R11.2

## 2013-09-25 HISTORY — DX: Supraventricular tachycardia: I47.1

## 2013-09-25 HISTORY — DX: Personal history of urinary calculi: Z87.442

## 2013-09-25 HISTORY — DX: Other specified postprocedural states: Z98.890

## 2013-09-25 HISTORY — DX: Nontoxic multinodular goiter: E04.2

## 2013-09-25 HISTORY — PX: CYSTOSCOPY WITH RETROGRADE PYELOGRAM, URETEROSCOPY AND STENT PLACEMENT: SHX5789

## 2013-09-25 LAB — POCT I-STAT 4, (NA,K, GLUC, HGB,HCT)
Glucose, Bld: 91 mg/dL (ref 70–99)
HCT: 44 % (ref 36.0–46.0)
Hemoglobin: 15 g/dL (ref 12.0–15.0)
Potassium: 4 mEq/L (ref 3.7–5.3)
Sodium: 139 mEq/L (ref 137–147)

## 2013-09-25 LAB — POCT PREGNANCY, URINE: Preg Test, Ur: NEGATIVE

## 2013-09-25 SURGERY — CYSTOURETEROSCOPY, WITH RETROGRADE PYELOGRAM AND STENT INSERTION
Anesthesia: General | Site: Ureter | Laterality: Left

## 2013-09-25 MED ORDER — MIDAZOLAM HCL 2 MG/2ML IJ SOLN
INTRAMUSCULAR | Status: AC
  Filled 2013-09-25: qty 2

## 2013-09-25 MED ORDER — BELLADONNA ALKALOIDS-OPIUM 16.2-60 MG RE SUPP
RECTAL | Status: AC
  Filled 2013-09-25: qty 1

## 2013-09-25 MED ORDER — TAMSULOSIN HCL 0.4 MG PO CAPS
ORAL_CAPSULE | ORAL | Status: AC
  Filled 2013-09-25: qty 1

## 2013-09-25 MED ORDER — CIPROFLOXACIN IN D5W 200 MG/100ML IV SOLN
200.0000 mg | INTRAVENOUS | Status: AC
  Administered 2013-09-25: 200 mg via INTRAVENOUS
  Filled 2013-09-25: qty 100

## 2013-09-25 MED ORDER — PHENAZOPYRIDINE HCL 200 MG PO TABS
200.0000 mg | ORAL_TABLET | Freq: Once | ORAL | Status: AC
  Administered 2013-09-25: 200 mg via ORAL
  Filled 2013-09-25: qty 1

## 2013-09-25 MED ORDER — LIDOCAINE HCL (CARDIAC) 20 MG/ML IV SOLN
INTRAVENOUS | Status: DC | PRN
  Administered 2013-09-25: 60 mg via INTRAVENOUS

## 2013-09-25 MED ORDER — PHENAZOPYRIDINE HCL 200 MG PO TABS: 200.0000 mg | ORAL_TABLET | Freq: Three times a day (TID) | ORAL | Status: DC | PRN

## 2013-09-25 MED ORDER — PHENAZOPYRIDINE HCL 100 MG PO TABS
ORAL_TABLET | ORAL | Status: AC
  Filled 2013-09-25: qty 2

## 2013-09-25 MED ORDER — LACTATED RINGERS IV SOLN
INTRAVENOUS | Status: DC
  Administered 2013-09-25 (×2): via INTRAVENOUS
  Filled 2013-09-25: qty 1000

## 2013-09-25 MED ORDER — ONDANSETRON HCL 4 MG/2ML IJ SOLN
INTRAMUSCULAR | Status: AC
  Filled 2013-09-25: qty 2

## 2013-09-25 MED ORDER — KETOROLAC TROMETHAMINE 10 MG PO TABS: 10.0000 mg | ORAL_TABLET | Freq: Four times a day (QID) | ORAL | Status: DC | PRN

## 2013-09-25 MED ORDER — DEXAMETHASONE SODIUM PHOSPHATE 4 MG/ML IJ SOLN
INTRAMUSCULAR | Status: DC | PRN
  Administered 2013-09-25: 10 mg via INTRAVENOUS

## 2013-09-25 MED ORDER — OXYBUTYNIN CHLORIDE 5 MG PO TABS
5.0000 mg | ORAL_TABLET | Freq: Once | ORAL | Status: AC
  Administered 2013-09-25: 5 mg via ORAL
  Filled 2013-09-25: qty 1

## 2013-09-25 MED ORDER — MIDAZOLAM HCL 5 MG/5ML IJ SOLN: INTRAMUSCULAR | Status: DC | PRN

## 2013-09-25 MED ORDER — FENTANYL CITRATE 0.05 MG/ML IJ SOLN
25.0000 ug | INTRAMUSCULAR | Status: DC | PRN
  Filled 2013-09-25: qty 1

## 2013-09-25 MED ORDER — ONDANSETRON HCL 4 MG/2ML IJ SOLN
INTRAMUSCULAR | Status: DC | PRN
  Administered 2013-09-25: 4 mg via INTRAVENOUS

## 2013-09-25 MED ORDER — FENTANYL CITRATE 0.05 MG/ML IJ SOLN
INTRAMUSCULAR | Status: AC
  Filled 2013-09-25: qty 2

## 2013-09-25 MED ORDER — FENTANYL CITRATE 0.05 MG/ML IJ SOLN
INTRAMUSCULAR | Status: DC | PRN
  Administered 2013-09-25: 50 ug via INTRAVENOUS

## 2013-09-25 MED ORDER — PROPOFOL 10 MG/ML IV BOLUS
INTRAVENOUS | Status: DC | PRN
  Administered 2013-09-25: 150 mg via INTRAVENOUS

## 2013-09-25 MED ORDER — KETOROLAC TROMETHAMINE 30 MG/ML IJ SOLN
INTRAMUSCULAR | Status: DC | PRN
  Administered 2013-09-25: 30 mg via INTRAVENOUS

## 2013-09-25 MED ORDER — TAMSULOSIN HCL 0.4 MG PO CAPS
0.4000 mg | ORAL_CAPSULE | Freq: Once | ORAL | Status: AC
  Administered 2013-09-25: 0.4 mg via ORAL
  Filled 2013-09-25: qty 1

## 2013-09-25 MED ORDER — LACTATED RINGERS IV SOLN
INTRAVENOUS | Status: DC
  Filled 2013-09-25: qty 1000

## 2013-09-25 MED ORDER — OXYBUTYNIN CHLORIDE 5 MG PO TABS
ORAL_TABLET | ORAL | Status: AC
  Filled 2013-09-25: qty 1

## 2013-09-25 MED ORDER — ONDANSETRON HCL 4 MG/2ML IJ SOLN
4.0000 mg | Freq: Once | INTRAMUSCULAR | Status: AC
  Administered 2013-09-25: 4 mg via INTRAVENOUS
  Filled 2013-09-25: qty 2

## 2013-09-25 MED ORDER — ACETAMINOPHEN 10 MG/ML IV SOLN
INTRAVENOUS | Status: DC | PRN
  Administered 2013-09-25: 1000 mg via INTRAVENOUS

## 2013-09-25 MED ORDER — SODIUM CHLORIDE 0.9 % IR SOLN
Status: DC | PRN
  Administered 2013-09-25: 6000 mL

## 2013-09-25 MED ORDER — SUCCINYLCHOLINE CHLORIDE 20 MG/ML IJ SOLN
INTRAMUSCULAR | Status: DC | PRN
  Administered 2013-09-25: 80 mg via INTRAVENOUS

## 2013-09-25 SURGICAL SUPPLY — 48 items
ADAPTER CATH URET PLST 4-6FR (CATHETERS) IMPLANT
ADPR CATH URET STRL DISP 4-6FR (CATHETERS)
BAG DRAIN URO-CYSTO SKYTR STRL (DRAIN) ×2 IMPLANT
BAG DRN UROCATH (DRAIN) ×1
BASKET LASER NITINOL 1.9FR (BASKET) IMPLANT
BASKET STNLS GEMINI 4WIRE 3FR (BASKET) IMPLANT
BASKET ZERO TIP NITINOL 2.4FR (BASKET) ×1 IMPLANT
BSKT STON RTRVL 120 1.9FR (BASKET)
BSKT STON RTRVL GEM 120X11 3FR (BASKET)
BSKT STON RTRVL ZERO TP 2.4FR (BASKET) ×1
CANISTER SUCT LVC 12 LTR MEDI- (MISCELLANEOUS) ×1 IMPLANT
CATH CLEAR GEL 3F BACKSTOP (CATHETERS) IMPLANT
CATH INTERMIT  6FR 70CM (CATHETERS) IMPLANT
CATH URET 5FR 28IN CONE TIP (BALLOONS)
CATH URET 5FR 70CM CONE TIP (BALLOONS) IMPLANT
CLOTH BEACON ORANGE TIMEOUT ST (SAFETY) ×2 IMPLANT
CONTOUR STENT ×1 IMPLANT
DRAPE CAMERA CLOSED 9X96 (DRAPES) ×2 IMPLANT
ELECT REM PT RETURN 9FT ADLT (ELECTROSURGICAL)
ELECTRODE REM PT RTRN 9FT ADLT (ELECTROSURGICAL) IMPLANT
FIBER LASER FLEXIVA 200 (UROLOGICAL SUPPLIES) ×1 IMPLANT
FIBER LASER FLEXIVA 365 (UROLOGICAL SUPPLIES) IMPLANT
FIBER LASER FLEXIVA 550 (UROLOGICAL SUPPLIES) IMPLANT
GLOVE BIO SURGEON STRL SZ8 (GLOVE) ×2 IMPLANT
GLOVE BIOGEL M STER SZ 6 (GLOVE) ×1 IMPLANT
GLOVE BIOGEL PI IND STRL 6.5 (GLOVE) IMPLANT
GLOVE BIOGEL PI INDICATOR 6.5 (GLOVE) ×2
GOWN PREVENTION PLUS LG XLONG (DISPOSABLE) ×1 IMPLANT
GOWN STRL REIN XL XLG (GOWN DISPOSABLE) ×1 IMPLANT
GOWN STRL REUS W/TWL LRG LVL3 (GOWN DISPOSABLE) ×1 IMPLANT
GOWN STRL REUS W/TWL XL LVL3 (GOWN DISPOSABLE) ×1 IMPLANT
GUIDEWIRE 0.038 PTFE COATED (WIRE) IMPLANT
GUIDEWIRE ANG ZIPWIRE 038X150 (WIRE) IMPLANT
GUIDEWIRE STR DUAL SENSOR (WIRE) ×2 IMPLANT
IV NS 1000ML (IV SOLUTION) ×2
IV NS 1000ML BAXH (IV SOLUTION) IMPLANT
IV NS IRRIG 3000ML ARTHROMATIC (IV SOLUTION) ×3 IMPLANT
KIT BALLIN UROMAX 15FX10 (LABEL) IMPLANT
KIT BALLN UROMAX 15FX4 (MISCELLANEOUS) IMPLANT
KIT BALLN UROMAX 26 75X4 (MISCELLANEOUS)
PACK CYSTOSCOPY (CUSTOM PROCEDURE TRAY) ×2 IMPLANT
SET HIGH PRES BAL DIL (LABEL)
SHEATH ACCESS URETERAL 38CM (SHEATH) IMPLANT
SHEATH ACCESS URETERAL 54CM (SHEATH) IMPLANT
SHEATH URET ACCESS 12FR/35CM (UROLOGICAL SUPPLIES) ×1 IMPLANT
SHEATH URET ACCESS 12FR/55CM (UROLOGICAL SUPPLIES) IMPLANT
STENT PERCUFLEX 4.8FRX24 (STENTS) ×1 IMPLANT
WATER STERILE IRR 3000ML UROMA (IV SOLUTION) IMPLANT

## 2013-09-25 NOTE — Transfer of Care (Signed)
Immediate Anesthesia Transfer of Care Note  Patient: [name] [name]  Procedure(s) Performed: Procedure(s): LEFT RETROGRADE PYELOGRAM, URETEROSCOPY LASER LITHO AND STENT PLACEMENT (Left) HOLMIUM LASER APPLICATION (Left)  Patient Location: PACU  Anesthesia Type:General  Level of Consciousness: awake, alert , oriented and patient cooperative  Airway & Oxygen Therapy: Patient Spontanous Breathing and Patient connected to nasal cannula oxygen  Post-op Assessment: Report given to PACU RN and Post -op Vital signs reviewed and stable  Post vital signs: Reviewed and stable  Complications: No apparent anesthesia complications

## 2013-09-25 NOTE — Anesthesia Preprocedure Evaluation (Addendum)
Anesthesia Evaluation  Patient identified by MRN, date of birth, ID band Patient awake    Reviewed: Allergy & Precautions, H&P , NPO status , Patient's Chart, lab work & pertinent test results, reviewed documented beta blocker date and time   History of Anesthesia Complications (+) PONV and history of anesthetic complications  Airway Mallampati: II TM Distance: >3 FB Neck ROM: full    Dental no notable dental hx. (+) Teeth Intact, Dental Advisory Given   Pulmonary neg pulmonary ROS,  breath sounds clear to auscultation  Pulmonary exam normal       Cardiovascular Exercise Tolerance: Good hypertension, Pt. on home beta blockers + dysrhythmias Supra Ventricular Tachycardia Rhythm:regular Rate:Normal  PSVT Took 09-24-13   Neuro/Psych negative neurological ROS  negative psych ROS   GI/Hepatic negative GI ROS, Neg liver ROS,   Endo/Other  negative endocrine ROSBenign goiter  Renal/GU negative Renal ROS  negative genitourinary   Musculoskeletal   Abdominal   Peds  Hematology negative hematology ROS (+)   Anesthesia Other Findings Prosthetic right arm  Reproductive/Obstetrics negative OB ROS                      Anesthesia Physical Anesthesia Plan  ASA: II  Anesthesia Plan: General   Post-op Pain Management:    Induction: Intravenous  Airway Management Planned: Oral ETT  Additional Equipment:   Intra-op Plan:   Post-operative Plan:   Informed Consent: I have reviewed the patients History and Physical, chart, labs and discussed the procedure including the risks, benefits and alternatives for the proposed anesthesia with the patient or authorized representative who has indicated his/her understanding and acceptance.   Dental Advisory Given  Plan Discussed with: CRNA and Surgeon  Anesthesia Plan Comments:        Anesthesia Quick Evaluation

## 2013-09-25 NOTE — Op Note (Signed)
PATIENT:  Charisse Marchara C Dileo  PRE-OPERATIVE DIAGNOSIS:  left Ureteral calculus  POST-OPERATIVE DIAGNOSIS: Same  PROCEDURE:  1. Cystoscopy with left retrograde pyelogram including interpretation. 2. Left ureteroscopy and laser lithotripsy 3. Left ureteroscopic stone extraction 4. Left double-J stent placement  SURGEON: Garnett FarmMark C Aundria Bitterman, MD  INDICATION: [name] is a 48 year old female patient with known cystinuria. She developed flank pain and irritative symptoms and was found to have a large stone in her distal left ureter. She was placed on medical expulsive therapy but her stone is quite large and has a very low probability of spontaneous passage. We therefore discussed proceeding with ureteroscopic management of her stone. She elected to proceed.  ANESTHESIA:  General  EBL:  Minimal  DRAINS: 4.8 French, 24 cm double-J stent in the left ureter (with string)  SPECIMEN:  Stone given the patient  DESCRIPTION OF PROCEDURE: The patient was taken to the major OR and placed on the table. General anesthesia was administered and then the patient was moved to the dorsal lithotomy position. The genitalia was sterilely prepped and draped. An official timeout was performed.  Initially the 22 French cystoscope with 12 lens was passed under direct vision. The bladder was then entered and fully inspected. It was noted be free of any tumors stones or inflammatory lesions. Ureteral orifices were of normal configuration and position. A 6 French open-ended ureteral catheter was then passed through the cystoscope into the ureteral orifice in order to perform a left retrograde pyelogram.  A retrograde pyelogram was performed by injecting full-strength contrast up the left ureter under direct fluoroscopic control. It revealed a filling defect in the ureter consistent with the stone seen on the preoperative KUB. The remainder of the ureter was noted to be normal as was the intrarenal collecting system. I then passed a  0.038 inch floppy-tipped guidewire through the open ended catheter and into the area of the renal pelvis and this was left in place. The inner portion of a ureteral access sheath was then passed over the guidewire to gently dilate the intramural ureter. I then proceeded with ureteroscopy.  A 6 French rigid ureteroscope was then passed under direct into the bladder and into the left orifice and up the ureter. The stone was identified and I felt it was too large to extract and therefore elected to proceed with laser lithotripsy. The 200  holmium laser fiber was used to fragment the stone. I then used the nitinol basket to extract all of the stone fragments and reinspection of the ureter ureteroscopically revealed no further stone fragments and no injury to the ureter. I then backloaded the cystoscope over the guidewire and passed the stent over the guidewire into the area of the renal pelvis. As the guidewire was removed good curl was noted in the renal pelvis. The bladder was drained and the cystoscope was then removed. The patient tolerated the procedure well no intraoperative complications.  PLAN OF CARE: Discharge to home after PACU  PATIENT DISPOSITION:  PACU - hemodynamically stable.

## 2013-09-25 NOTE — Discharge Instructions (Signed)
Post stone removal/stent placement surgery instructions ° ° °Definitions: ° °Ureter: The duct that transports urine from the kidney to the bladder. °Stent: A plastic hollow tube that is placed into the ureter, from the kidney to the bladder to prevent the ureter from swelling shut. ° °General instructions: ° °Despite the fact that no skin incisions were used, the area around the ureter and bladder is raw and irritated. The stent is a foreign body which will further irritate the bladder wall. This irritation is manifested by increased frequency of urination, both day and night, and by an increase in the urge to urinate. In some, the urge to urinate is present almost always. Sometimes the urge is strong enough that you may not be able to stop your self from urinating. The only real cure is to remove the stent and then give time for the bladder wall to heal which can't be done until the danger of the ureter swelling shut has passed. (This varies from 2-21 days). ° °You may see some blood in your urine while the stent is in place and a few days afterward. Do not be alarmed, even if the urine is clear for a while. Get off your feet and drink lots of fluids until clearing occurs. If you start to pass clots or don't improve, call us. ° °If you have a string coming from your urethra:  The stent string is attached to your ureteral stent.  Do not pull on thisIf you have a string coming from your urethra:  The stent string is attached to your ureteral stent.  Do not pull on this. ° °Diet: ° °You may return to your normal diet immediately. Because of the raw surface of your bladder, alcohol, spicy foods, foods high in acid and drinks with caffeine may cause irritation or frequency and should be used in moderation. To keep your urine flowing freely and avoid constipation, drink plenty of fluids during the day (8-10 glasses). Tip: Avoid cranberry juice because it is very acidic. ° °Activity: ° °Your physical activity doesn't need  to be restricted. However, if you are very active, you may see some blood in the urine. We suggest that you reduce your activity under the circumstances until the bleeding has stopped. ° °Bowels: ° °It is important to keep your bowels regular during the postoperative period. Straining with bowel movements can cause bleeding. A bowel movement every other day is reasonable. Use a mild laxative if needed, such as milk of magnesia 2-3 tablespoons, or 2 Dulcolax tablets. Call if you continue to have problems. If you had been taking narcotics for pain, before, during or after your surgery, you may be constipated. Take a laxative if necessary. ° ° ° ° °Medication: ° °You should resume your pre-surgery medications unless told not to. DO NOT RESUME YOUR ASPIRIN, or any other medicines like ibuprofen, motrin, excedrin, advil, aleve, vitamin E, fish oil as these can all cause bleeding x 7 days. In addition you may be given an antibiotic to prevent or treat infection. Antibiotics are not always necessary. All medication should be taken as prescribed until the bottles are finished unless you are having an unusual reaction to one of the drugs. ° °Problems you should report to us: ° °a. Fever greater than 101°F. °b. Heavy bleeding, or clots (see notes above about blood in urine). °c. Inability to urinate. °d. Drug reactions (hives, rash, nausea, vomiting, diarrhea). °e. Severe burning or pain with urination that is not improving. ° °Followup: ° °  You will need a followup appointment to monitor your progress in most cases. Please call the office for this appointment when you get home if your appointment has not already been scheduled. Usually the first appointment will be about 5-14 days after your surgery and if you have a stent in place it will likely be removed at that time. ° °Post Anesthesia Home Care Instructions ° °Activity: °Get plenty of rest for the remainder of the day. A responsible adult should stay with you for 24  hours following the procedure.  °For the next 24 hours, DO NOT: °-Drive a car °-Operate machinery °-Drink alcoholic beverages °-Take any medication unless instructed by your physician °-Make any legal decisions or sign important papers. ° °Meals: °Start with liquid foods such as gelatin or soup. Progress to regular foods as tolerated. Avoid greasy, spicy, heavy foods. If nausea and/or vomiting occur, drink only clear liquids until the nausea and/or vomiting subsides. Call your physician if vomiting continues. ° °Special Instructions/Symptoms: °Your throat may feel dry or sore from the anesthesia or the breathing tube placed in your throat during surgery. If this causes discomfort, gargle with warm salt water. The discomfort should disappear within 24 hours. ° ° °

## 2013-09-25 NOTE — Transfer of Care (Deleted)
Immediate Anesthesia Transfer of Care Note  Patient: [name]  Procedure(s) Performed: Procedure(s): LEFT RETROGRADE PYELOGRAM, URETEROSCOPY LASER LITHO AND STENT PLACEMENT (Left) HOLMIUM LASER APPLICATION (Left)  Patient Location: PACU  Anesthesia Type:General  Level of Consciousness: awake, alert , oriented and patient cooperative  Airway & Oxygen Therapy: Patient Spontanous Breathing and Patient connected to nasal cannula oxygen  Post-op Assessment: Report given to PACU RN and Post -op Vital signs reviewed and stable  Post vital signs: Reviewed and stable  Complications: No apparent anesthesia complications 

## 2013-09-25 NOTE — Anesthesia Procedure Notes (Signed)
Procedure Name: Intubation Date/Time: 09/25/2013 3:01 PM Performed by: Tyrone NineSAUVE, Ifeoluwa Beller F Pre-anesthesia Checklist: Patient identified, Timeout performed, Emergency Drugs available, Suction available and Patient being monitored Patient Re-evaluated:Patient Re-evaluated prior to inductionOxygen Delivery Method: Circle system utilized Preoxygenation: Pre-oxygenation with 100% oxygen Intubation Type: IV induction and Rapid sequence Laryngoscope Size: Mac and 3 Grade View: Grade I Tube type: Oral Tube size: 7.0 mm Number of attempts: 1 Airway Equipment and Method: Stylet and Bite block Placement Confirmation: ETT inserted through vocal cords under direct vision,  breath sounds checked- equal and bilateral and positive ETCO2 Secured at: 20 cm Tube secured with: Tape Dental Injury: Teeth and Oropharynx as per pre-operative assessment  Comments: RSI as pt with N/V in pre-op holding area

## 2013-09-26 ENCOUNTER — Encounter (HOSPITAL_BASED_OUTPATIENT_CLINIC_OR_DEPARTMENT_OTHER): Payer: Self-pay | Admitting: Urology

## 2013-09-28 NOTE — Anesthesia Postprocedure Evaluation (Signed)
  Anesthesia Post-op Note  Patient: [name] [name]  Procedure(s) Performed: Procedure(s) (LRB): LEFT RETROGRADE PYELOGRAM, URETEROSCOPY LASER LITHO AND STENT PLACEMENT (Left) HOLMIUM LASER APPLICATION (Left)  Patient Location: PACU  Anesthesia Type: General  Level of Consciousness: awake and alert   Airway and Oxygen Therapy: Patient Spontanous Breathing  Post-op Pain: mild  Post-op Assessment: Post-op Vital signs reviewed, Patient's Cardiovascular Status Stable, Respiratory Function Stable, Patent Airway and No signs of Nausea or vomiting  Last Vitals:  Filed Vitals:   09/25/13 1735  BP: 122/76  Pulse: 63  Temp: 36.2 C  Resp: 18    Post-op Vital Signs: stable   Complications: No apparent anesthesia complications

## 2013-10-02 ENCOUNTER — Other Ambulatory Visit (HOSPITAL_COMMUNITY): Payer: Self-pay | Admitting: Urology

## 2013-10-02 ENCOUNTER — Other Ambulatory Visit: Payer: Self-pay | Admitting: Urology

## 2013-10-02 ENCOUNTER — Ambulatory Visit: Payer: BC Managed Care – PPO

## 2013-10-02 DIAGNOSIS — N200 Calculus of kidney: Secondary | ICD-10-CM

## 2013-10-09 ENCOUNTER — Ambulatory Visit
Admission: RE | Admit: 2013-10-09 | Discharge: 2013-10-09 | Disposition: A | Discharge status: Home / Self Care | Payer: BC Managed Care – PPO | Source: Ambulatory Visit

## 2013-10-09 DIAGNOSIS — Z1231 Encounter for screening mammogram for malignant neoplasm of breast: Secondary | ICD-10-CM

## 2013-10-16 ENCOUNTER — Other Ambulatory Visit: Payer: Self-pay | Admitting: Cardiology

## 2013-10-21 ENCOUNTER — Other Ambulatory Visit: Payer: Self-pay | Admitting: *Deleted

## 2013-10-21 MED ORDER — METOPROLOL SUCCINATE ER 50 MG PO TB24: ORAL_TABLET | ORAL | Status: DC

## 2013-10-23 ENCOUNTER — Encounter: Payer: Self-pay | Admitting: Cardiology

## 2013-11-17 ENCOUNTER — Other Ambulatory Visit: Payer: Self-pay | Admitting: Obstetrics & Gynecology

## 2013-11-18 LAB — CYTOLOGY - PAP

## 2013-11-20 ENCOUNTER — Ambulatory Visit: Payer: BC Managed Care – PPO | Admitting: Cardiology

## 2013-11-25 ENCOUNTER — Encounter (HOSPITAL_COMMUNITY): Payer: Self-pay | Admitting: Pharmacy Technician

## 2013-11-27 ENCOUNTER — Encounter: Payer: Self-pay | Admitting: Cardiology

## 2013-11-27 ENCOUNTER — Ambulatory Visit (INDEPENDENT_AMBULATORY_CARE_PROVIDER_SITE_OTHER): Payer: BC Managed Care – PPO | Admitting: Cardiology

## 2013-11-27 VITALS — BP 105/70 | HR 62 | Ht 64.0 in | Wt 142.7 lb

## 2013-11-27 DIAGNOSIS — E7200 Disorders of amino-acid transport, unspecified: Secondary | ICD-10-CM

## 2013-11-27 DIAGNOSIS — I10 Essential (primary) hypertension: Secondary | ICD-10-CM

## 2013-11-27 DIAGNOSIS — I498 Other specified cardiac arrhythmias: Secondary | ICD-10-CM

## 2013-11-27 MED ORDER — METOPROLOL SUCCINATE ER 50 MG PO TB24: 50.0000 mg | ORAL_TABLET | Freq: Every day | ORAL | Status: DC

## 2013-11-27 NOTE — Assessment & Plan Note (Signed)
Controlled.  

## 2013-11-27 NOTE — Patient Instructions (Signed)
Continue current medication.  Your physician recommends that you schedule a follow-up appointment in: One year with Dr. Jens Somrenshaw.

## 2013-11-27 NOTE — Progress Notes (Signed)
11/27/2013   PCP: Rudi Heap, MD   Chief Complaint  Patient presents with  . Annual Exam    appt scheduled to get a refill on medication.  No complaints of chest pain, SOB, edema or dizziness.  Scheduled for kidney stone removal 12/15/13 by Dr. Phoebe Perch.  Had one removed in May.    Primary Cardiologist: Dr. Velta Addison   HPI:  [name] [name], 48 year old female who has a history of hypertension, SVT, and cystinuria. According to previous notes when she was cared for by Dr Corinda Gubler, she has a history of SVT although we do not have actual strips. A previous echocardiogram in September 2003, showed normal LV function and a Myoview performed inSeptember 2003, showed no scar or ischemia with an ejection fraction 74%. Patient denies any dyspnea on exertion, orthopnea, PND, pedal edema, palpitations, syncope or chest pain.    Had kidney stone removed in May and is scheduled for another for staghorn calculus removal in August.  No episodes of tachycardia, palpitations.  No chest pain or SOB.  Has been diagnosed with thyroid nodules but biopsy was negative.  Has no complaints. Does well on her toprol.   Allergies  Allergen Reactions  . Demerol [Meperidine Hcl] Nausea And Vomiting  . Morphine Nausea And Vomiting  . Bactrim [Sulfamethoxazole-Tmp Ds] Hives    Current Outpatient Prescriptions  Medication Sig Dispense Refill  . metoprolol succinate (TOPROL-XL) 50 MG 24 hr tablet Take 1 tablet (50 mg total) by mouth at bedtime. Take with or immediately following a meal.  30 tablet  11   No current facility-administered medications for this visit.    Past Medical History  Diagnosis Date  . Presence of left artificial arm   . Hypertension   . PSVT (paroxysmal supraventricular tachycardia)     CARDIOLOGIST --  DR CRENSHAW  . History of kidney stones     CYSTINE STONES  . Left ureteral calculus   . Multiple thyroid nodules     W/ GOITER--  LAST BX  BENIGN  . PONV  (postoperative nausea and vomiting)   . Congenital absence of upper limb     LEFT    Past Surgical History  Procedure Laterality Date  . Percutaneous nephrostolithotomy Bilateral LEFT  05-30-2002/   RIGHT 12-13-1999  . Right ureteroscopic stone extraction / stent placement  07-03-2000  . Cysto/  right ureteral stent placement  07-10-2000  . Cytso/  left ureteral stent placement  04-12-2006  &  06-05-2002  . Cysto/  right retrograde pyelogram/ right ureteroscopy  02-10-2005/   02-17-2005/   03-18-2001    02-17-2005-- STENT PLACEMENT  . Dilation and curettage of uterus  04-09-2004    W/  SUCTION  . Cysto/  bilateral ureteroscopic laser lithotripsy/  bilateral ureteral stone extraction/  bilateral stent placement  01-22-2008  . Cystoscopy with retrograde pyelogram, ureteroscopy and stent placement Left 09/25/2013    Procedure: LEFT RETROGRADE PYELOGRAM, URETEROSCOPY LASER LITHO AND STENT PLACEMENT;  Surgeon: Garnett Farm, MD;  Location: [facility];  Service: Urology;  Laterality: Left;  . Holmium laser application Left 09/25/2013    Procedure: HOLMIUM LASER APPLICATION;  Surgeon: Garnett Farm, MD;  Location: [facility];  Service: Urology;  Laterality: Left;    ZOX:WRUEAVW:UJ colds or fevers, no weight changes Skin:no rashes or ulcers HEENT:no blurred vision, no congestion CV:see HPI PUL:see HPI GI:no diarrhea constipation or melena, no indigestion GU:no hematuria, no  dysuria, see HPI MS:no joint pain, no claudication Neuro:no syncope, no lightheadedness Endo:no diabetes, no thyroid disease  Wt Readings from Last 3 Encounters:  11/27/13 142 lb 11.2 oz (64.728 kg)  09/25/13 140 lb (63.504 kg)  09/25/13 140 lb (63.504 kg)    PHYSICAL EXAM BP 105/70  Pulse 62  Ht 5\' 4"  (1.626 m)  Wt 142 lb 11.2 oz (64.728 kg)  BMI 24.48 kg/m2 General:Pleasant affect, NAD Skin:Warm and dry, brisk capillary refill HEENT:normocephalic, sclera clear, mucus  membranes moist Neck:supple, no JVD, no bruits  Heart:S1S2 RRR without murmur, gallup, rub or click Lungs:clear without rales, rhonchi, or wheezes UVO:ZDGUAbd:soft, non tender, + BS, do not palpate liver spleen or masses Ext:no lower ext edema, 2+ pedal pulses, 2+ radial pulses Neuro:alert and oriented, MAE, follows commands, + facial symmetry EKG:SR rate 66 continues with early repol.  No changes on EKG.  ASSESSMENT AND PLAN SUPRAVENTRICULAR TACHYCARDIA No episodes on toprol.  No complaints, follow up with Dr. Velta AddisonB. Crenshaw in 1 year.  Refilled toprol  HYPERTENSION, UNSPECIFIED Controlled.  CYSTINURIA For percutaneous removal of staghorn kidney stone in August, had similar procedure in May without complications.

## 2013-11-27 NOTE — Assessment & Plan Note (Addendum)
No episodes on toprol.  No complaints, follow up with Dr. Velta AddisonB. Crenshaw in 1 year.  Refilled toprol

## 2013-11-27 NOTE — Assessment & Plan Note (Signed)
For percutaneous removal of staghorn kidney stone in August, had similar procedure in May without complications.

## 2013-12-04 ENCOUNTER — Encounter (HOSPITAL_COMMUNITY): Payer: Self-pay

## 2013-12-04 ENCOUNTER — Encounter (HOSPITAL_COMMUNITY)
Admission: RE | Admit: 2013-12-04 | Discharge: 2013-12-04 | Disposition: A | Discharge status: Home / Self Care | Payer: BC Managed Care – PPO | Source: Ambulatory Visit | Attending: Urology | Admitting: Urology

## 2013-12-04 ENCOUNTER — Ambulatory Visit (HOSPITAL_COMMUNITY)
Admission: RE | Admit: 2013-12-04 | Discharge: 2013-12-04 | Disposition: A | Discharge status: Home / Self Care | Payer: BC Managed Care – PPO | Source: Ambulatory Visit | Attending: Anesthesiology | Admitting: Anesthesiology

## 2013-12-04 ENCOUNTER — Other Ambulatory Visit: Payer: Self-pay | Admitting: Radiology

## 2013-12-04 DIAGNOSIS — M47814 Spondylosis without myelopathy or radiculopathy, thoracic region: Secondary | ICD-10-CM | POA: Insufficient documentation

## 2013-12-04 DIAGNOSIS — Z01812 Encounter for preprocedural laboratory examination: Secondary | ICD-10-CM | POA: Insufficient documentation

## 2013-12-04 DIAGNOSIS — Z01818 Encounter for other preprocedural examination: Secondary | ICD-10-CM | POA: Insufficient documentation

## 2013-12-04 HISTORY — DX: Congenital complete absence of right upper limb: Q71.01

## 2013-12-04 LAB — CBC
HEMATOCRIT: 42.1 % (ref 36.0–46.0)
Hemoglobin: 14.6 g/dL (ref 12.0–15.0)
MCH: 29.9 pg (ref 26.0–34.0)
MCHC: 34.7 g/dL (ref 30.0–36.0)
MCV: 86.3 fL (ref 78.0–100.0)
Platelets: 271 10*3/uL (ref 150–400)
RBC: 4.88 MIL/uL (ref 3.87–5.11)
RDW: 12.8 % (ref 11.5–15.5)
WBC: 6.4 10*3/uL (ref 4.0–10.5)

## 2013-12-04 LAB — BASIC METABOLIC PANEL
ANION GAP: 9 (ref 5–15)
BUN: 15 mg/dL (ref 6–23)
CO2: 25 mEq/L (ref 19–32)
Calcium: 8.9 mg/dL (ref 8.4–10.5)
Chloride: 102 mEq/L (ref 96–112)
Creatinine, Ser: 0.63 mg/dL (ref 0.50–1.10)
Glucose, Bld: 83 mg/dL (ref 70–99)
POTASSIUM: 4.9 meq/L (ref 3.7–5.3)
Sodium: 136 mEq/L — ABNORMAL LOW (ref 137–147)

## 2013-12-04 LAB — HCG, SERUM, QUALITATIVE: Preg, Serum: NEGATIVE

## 2013-12-04 NOTE — Pre-Procedure Instructions (Addendum)
EKG REPORT AND CARDIOLOGY OFFICE NOTE 11-27-13 ARE IN EPIC FROM Nada BoozerLAURA INGOLD, NP WITH [facility]. CXR WAS DONE TODAY [facility]. MESSAGE LEFT ON INTERVENTIONAL RADIOLOGY PA VOICE MAIL / OFFICE THAT ORDERS NEEDED IN EPIC - PT'S PREOP WAS TODAY - LABS WERE DONE AS PER ANESTHESIOLOGIST'S GUIDELINES.

## 2013-12-04 NOTE — Patient Instructions (Signed)
[facility]  ON:   Monday  AUGUST 10TH  REPORT TO  RADIOLOGY TO REGISTER AT 7:15 AM.   PLEASE COME IN THE [facility] MAIN HOSPITAL ENTRANCE AND FOLLOW SIGNS TO RADIOLOGY.  DO NOT EAT OR DRINK ANYTHING AFTER MIDNIGHT THE NIGHT BEFORE YOUR SURGERY.  YOU MAY BRUSH YOUR TEETH, RINSE OUT YOUR MOUTH--BUT NO WATER, NO FOOD, NO CHEWING GUM, NO MINTS, NO CANDIES, NO CHEWING TOBACCO.  PLEASE TAKE THE FOLLOWING MEDICATIONS THE AM OF YOUR SURGERY WITH A FEW SIPS OF WATER:  NO MEDICATIONS TO TAKE  I DO NOT BRING VALUABLES, MONEY, CREDIT CARDS.  DO NOT WEAR JEWELRY, MAKE-UP, NAIL POLISH AND NO METAL PINS OR CLIPS IN YOUR HAIR. CONTACT LENS, DENTURES / PARTIALS, GLASSES SHOULD NOT BE WORN TO SURGERY AND IN MOST CASES-HEARING AIDS WILL NEED TO BE REMOVED.  BRING YOUR GLASSES CASE, ANY EQUIPMENT NEEDED FOR YOUR CONTACT LENS. FOR PATIENTS ADMITTED TO THE HOSPITAL--CHECK OUT TIME THE DAY OF DISCHARGE IS 11:00 AM.  ALL INPATIENT ROOMS ARE PRIVATE - WITH BATHROOM, TELEPHONE, TELEVISION AND WIFI INTERNET.                                                    PLEASE READ OVER ANY  FACT SHEETS THAT YOU WERE GIVEN: MRSA INFORMATION, BLOOD TRANSFUSION INFORMATION, INCENTIVE SPIROMETER INFORMATION.  PLEASE BE AWARE THAT YOU MAY NEED ADDITIONAL BLOOD DRAWN DAY OF YOUR SURGERY  _______________________________________________________________________   [facility] - Pre[facility] Before surgery, you can play an important role.  Because skin is not sterile, your skin needs to be as free of germs as possible.  You can reduce the number of germs on your skin by washing with CHG (chlorahexidine gluconate) soap before surgery.  CHG is an antiseptic cleaner which kills germs and bonds with the skin to continue killing germs even after washing. Please DO NOT use if you have an allergy to CHG or antibacterial soaps.  If your skin becomes reddened/irritated stop using the CHG and  inform your nurse when you arrive at Short Stay. Do not shave (including legs and underarms) for at least 48 hours prior to the first CHG shower.  You may shave your face/neck. Please follow these instructions carefully:  1.  Shower with CHG Soap the night before surgery and the  morning of Surgery.  2.  If you choose to wash your hair, wash your hair first as usual with your  normal  shampoo.  3.  After you shampoo, rinse your hair and body thoroughly to remove the  shampoo.                           4.  Use CHG as you would any other liquid soap.  You can apply chg directly  to the skin and wash                       Gently with a scrungie or clean washcloth.  5.  Apply the CHG Soap to your body ONLY FROM THE NECK DOWN.   Do not use on face/ open                           Wound  or open sores. Avoid contact with eyes, ears mouth and genitals (private parts).                       Wash face,  Genitals (private parts) with your normal soap.             6.  Wash thoroughly, paying special attention to the area where your surgery  will be performed.  7.  Thoroughly rinse your body with warm water from the neck down.  8.  DO NOT shower/wash with your normal soap after using and rinsing off  the CHG Soap.                9.  Pat yourself dry with a clean towel.            10.  Wear clean pajamas.            11.  Place clean sheets on your bed the night of your first shower and do not  sleep with pets. Day of Surgery : Do not apply any lotions/deodorants the morning of surgery.  Please wear clean clothes to the hospital/surgery center.  FAILURE TO FOLLOW THESE INSTRUCTIONS MAY RESULT IN THE CANCELLATION OF YOUR SURGERY PATIENT SIGNATURE_________________________________  NURSE SIGNATURE__________________________________  ________________________________________________________________________  WHAT IS A BLOOD TRANSFUSION? Blood Transfusion Information  A transfusion is the replacement of blood or  some of its parts. Blood is made up of multiple cells which provide different functions.  Red blood cells carry oxygen and are used for blood loss replacement.  White blood cells fight against infection.  Platelets control bleeding.  Plasma helps clot blood.  Other blood products are available for specialized needs, such as hemophilia or other clotting disorders. BEFORE THE TRANSFUSION  Who gives blood for transfusions?   Healthy volunteers who are fully evaluated to make sure their blood is safe. This is blood bank blood. Transfusion therapy is the safest it has ever been in the practice of medicine. Before blood is taken from a donor, a complete history is taken to make sure that person has no history of diseases nor engages in risky social behavior (examples are intravenous drug use or sexual activity with multiple partners). The donor's travel history is screened to minimize risk of transmitting infections, such as malaria. The donated blood is tested for signs of infectious diseases, such as HIV and hepatitis. The blood is then tested to be sure it is compatible with you in order to minimize the chance of a transfusion reaction. If you or a relative donates blood, this is often done in anticipation of surgery and is not appropriate for emergency situations. It takes many days to process the donated blood. RISKS AND COMPLICATIONS Although transfusion therapy is very safe and saves many lives, the main dangers of transfusion include:   Getting an infectious disease.  Developing a transfusion reaction. This is an allergic reaction to something in the blood you were given. Every precaution is taken to prevent this. The decision to have a blood transfusion has been considered carefully by your caregiver before blood is given. Blood is not given unless the benefits outweigh the risks. AFTER THE TRANSFUSION  Right after receiving a blood transfusion, you will usually feel much better and more  energetic. This is especially true if your red blood cells have gotten low (anemic). The transfusion raises the level of the red blood cells which carry oxygen, and this usually causes an energy increase.  The  nurse administering the transfusion will monitor you carefully for complications. HOME CARE INSTRUCTIONS  No special instructions are needed after a transfusion. You may find your energy is better. Speak with your caregiver about any limitations on activity for underlying diseases you may have. SEEK MEDICAL CARE IF:   Your condition is not improving after your transfusion.  You develop redness or irritation at the intravenous (IV) site. SEEK IMMEDIATE MEDICAL CARE IF:  Any of the following symptoms occur over the next 12 hours:  Shaking chills.  You have a temperature by mouth above 102 F (38.9 C), not controlled by medicine.  Chest, back, or muscle pain.  People around you feel you are not acting correctly or are confused.  Shortness of breath or difficulty breathing.  Dizziness and fainting.  You get a rash or develop hives.  You have a decrease in urine output.  Your urine turns a dark color or changes to pink, red, or brown. Any of the following symptoms occur over the next 10 days:  You have a temperature by mouth above 102 F (38.9 C), not controlled by medicine.  Shortness of breath.  Weakness after normal activity.  The white part of the eye turns yellow (jaundice).  You have a decrease in the amount of urine or are urinating less often.  Your urine turns a dark color or changes to pink, red, or brown. Document Released: 04/21/2000 Document Revised: 07/17/2011 Document Reviewed: 12/09/2007 [facility] Patient Information 2014 [place], [place].  _______________________________________________________________________

## 2013-12-11 ENCOUNTER — Other Ambulatory Visit: Payer: Self-pay | Admitting: Radiology

## 2013-12-14 MED ORDER — CEFAZOLIN SODIUM-DEXTROSE 2-3 GM-% IV SOLR: 2.0000 g | INTRAVENOUS | Status: DC

## 2013-12-15 ENCOUNTER — Encounter (HOSPITAL_COMMUNITY): Payer: Self-pay | Admitting: Registered Nurse

## 2013-12-15 ENCOUNTER — Ambulatory Visit (HOSPITAL_COMMUNITY): Payer: BC Managed Care – PPO | Admitting: Registered Nurse

## 2013-12-15 ENCOUNTER — Ambulatory Visit (HOSPITAL_COMMUNITY): Payer: BC Managed Care – PPO

## 2013-12-15 ENCOUNTER — Ambulatory Visit (HOSPITAL_COMMUNITY)
Admission: RE | Admit: 2013-12-15 | Discharge: 2013-12-15 | Disposition: A | Discharge status: Home / Self Care | Payer: BC Managed Care – PPO | Source: Ambulatory Visit | Attending: Urology | Admitting: Urology

## 2013-12-15 ENCOUNTER — Ambulatory Visit (HOSPITAL_COMMUNITY)
Admission: RE | Admit: 2013-12-15 | Discharge: 2013-12-16 | Disposition: A | Discharge status: Home / Self Care | Payer: BC Managed Care – PPO | Source: Ambulatory Visit | Attending: Urology | Admitting: Urology

## 2013-12-15 ENCOUNTER — Encounter (HOSPITAL_COMMUNITY)
Admission: RE | Disposition: A | Discharge status: Home / Self Care | Payer: Self-pay | Source: Ambulatory Visit | Attending: Urology

## 2013-12-15 ENCOUNTER — Encounter (HOSPITAL_COMMUNITY): Payer: BC Managed Care – PPO | Admitting: Registered Nurse

## 2013-12-15 ENCOUNTER — Encounter (HOSPITAL_COMMUNITY): Payer: Self-pay

## 2013-12-15 VITALS — BP 116/75 | HR 60 | Temp 97.9°F | Resp 12 | Ht 64.0 in | Wt 143.0 lb

## 2013-12-15 DIAGNOSIS — Z79899 Other long term (current) drug therapy: Secondary | ICD-10-CM | POA: Insufficient documentation

## 2013-12-15 DIAGNOSIS — Q7190 Unspecified reduction defect of unspecified upper limb: Secondary | ICD-10-CM | POA: Insufficient documentation

## 2013-12-15 DIAGNOSIS — I10 Essential (primary) hypertension: Secondary | ICD-10-CM | POA: Diagnosis not present

## 2013-12-15 DIAGNOSIS — N200 Calculus of kidney: Secondary | ICD-10-CM

## 2013-12-15 DIAGNOSIS — D62 Acute posthemorrhagic anemia: Secondary | ICD-10-CM | POA: Insufficient documentation

## 2013-12-15 DIAGNOSIS — E7200 Disorders of amino-acid transport, unspecified: Secondary | ICD-10-CM | POA: Diagnosis not present

## 2013-12-15 HISTORY — PX: NEPHROLITHOTOMY: SHX5134

## 2013-12-15 HISTORY — PX: HOLMIUM LASER APPLICATION: SHX5852

## 2013-12-15 LAB — HEMOGLOBIN AND HEMATOCRIT, BLOOD
HCT: 29.9 % — ABNORMAL LOW (ref 36.0–46.0)
Hemoglobin: 10.5 g/dL — ABNORMAL LOW (ref 12.0–15.0)

## 2013-12-15 LAB — TYPE AND SCREEN
ABO/RH(D): O NEG
ANTIBODY SCREEN: NEGATIVE

## 2013-12-15 LAB — PROTIME-INR
INR: 0.93 (ref 0.00–1.49)
PROTHROMBIN TIME: 12.5 s (ref 11.6–15.2)

## 2013-12-15 LAB — APTT: APTT: 30 s (ref 24–37)

## 2013-12-15 LAB — ABO/RH: ABO/RH(D): O NEG

## 2013-12-15 SURGERY — NEPHROLITHOTOMY PERCUTANEOUS
Anesthesia: General | Laterality: Right

## 2013-12-15 MED ORDER — SUCCINYLCHOLINE CHLORIDE 20 MG/ML IJ SOLN
INTRAMUSCULAR | Status: DC | PRN
  Administered 2013-12-15: 100 mg via INTRAVENOUS

## 2013-12-15 MED ORDER — MIDAZOLAM HCL 2 MG/2ML IJ SOLN
INTRAMUSCULAR | Status: AC | PRN
  Administered 2013-12-15: 1 mg via INTRAVENOUS
  Administered 2013-12-15: 2 mg via INTRAVENOUS
  Administered 2013-12-15 (×2): 1 mg via INTRAVENOUS

## 2013-12-15 MED ORDER — LIDOCAINE HCL 1 % IJ SOLN
INTRAMUSCULAR | Status: AC
  Filled 2013-12-15: qty 20

## 2013-12-15 MED ORDER — CEFAZOLIN SODIUM 1-5 GM-% IV SOLN
1.0000 g | Freq: Three times a day (TID) | INTRAVENOUS | Status: AC
  Administered 2013-12-15 – 2013-12-16 (×2): 1 g via INTRAVENOUS
  Filled 2013-12-15 (×2): qty 50

## 2013-12-15 MED ORDER — FENTANYL CITRATE 0.05 MG/ML IJ SOLN
INTRAMUSCULAR | Status: AC
  Filled 2013-12-15: qty 6

## 2013-12-15 MED ORDER — DEXAMETHASONE SODIUM PHOSPHATE 10 MG/ML IJ SOLN
INTRAMUSCULAR | Status: DC | PRN
  Administered 2013-12-15: 10 mg via INTRAVENOUS

## 2013-12-15 MED ORDER — FENTANYL CITRATE 0.05 MG/ML IJ SOLN
INTRAMUSCULAR | Status: DC | PRN
  Administered 2013-12-15 (×4): 50 ug via INTRAVENOUS

## 2013-12-15 MED ORDER — HYOSCYAMINE SULFATE 0.125 MG SL SUBL
0.1250 mg | SUBLINGUAL_TABLET | SUBLINGUAL | Status: DC | PRN
  Filled 2013-12-15: qty 1

## 2013-12-15 MED ORDER — SODIUM CHLORIDE 0.9 % IR SOLN
Status: DC | PRN
  Administered 2013-12-15: 27000 mL

## 2013-12-15 MED ORDER — ONDANSETRON HCL 4 MG/2ML IJ SOLN
4.0000 mg | INTRAMUSCULAR | Status: DC | PRN
  Administered 2013-12-15: 4 mg via INTRAVENOUS
  Filled 2013-12-15: qty 2

## 2013-12-15 MED ORDER — IOHEXOL 300 MG/ML  SOLN
10.0000 mL | Freq: Once | INTRAMUSCULAR | Status: AC | PRN
  Administered 2013-12-15: 1 mL

## 2013-12-15 MED ORDER — HYDROMORPHONE HCL PF 1 MG/ML IJ SOLN
INTRAMUSCULAR | Status: AC
  Filled 2013-12-15: qty 1

## 2013-12-15 MED ORDER — PROMETHAZINE HCL 25 MG/ML IJ SOLN: 6.2500 mg | INTRAMUSCULAR | Status: DC | PRN

## 2013-12-15 MED ORDER — EPHEDRINE SULFATE 50 MG/ML IJ SOLN
INTRAMUSCULAR | Status: DC | PRN
  Administered 2013-12-15 (×2): 5 mg via INTRAVENOUS

## 2013-12-15 MED ORDER — FENTANYL CITRATE 0.05 MG/ML IJ SOLN
INTRAMUSCULAR | Status: AC
  Filled 2013-12-15: qty 5

## 2013-12-15 MED ORDER — PROPOFOL 10 MG/ML IV BOLUS
INTRAVENOUS | Status: DC | PRN
  Administered 2013-12-15: 150 mg via INTRAVENOUS

## 2013-12-15 MED ORDER — SODIUM CHLORIDE 0.9 % IV SOLN
INTRAVENOUS | Status: DC
  Administered 2013-12-15: 08:00:00 via INTRAVENOUS

## 2013-12-15 MED ORDER — ROCURONIUM BROMIDE 100 MG/10ML IV SOLN
INTRAVENOUS | Status: AC
  Filled 2013-12-15: qty 1

## 2013-12-15 MED ORDER — CEFAZOLIN SODIUM-DEXTROSE 2-3 GM-% IV SOLR
INTRAVENOUS | Status: AC
  Filled 2013-12-15: qty 50

## 2013-12-15 MED ORDER — ONDANSETRON HCL 4 MG/2ML IJ SOLN
INTRAMUSCULAR | Status: AC
  Filled 2013-12-15: qty 2

## 2013-12-15 MED ORDER — NEOSTIGMINE METHYLSULFATE 10 MG/10ML IV SOLN
INTRAVENOUS | Status: DC | PRN
  Administered 2013-12-15: 3 mg via INTRAVENOUS

## 2013-12-15 MED ORDER — CEFAZOLIN SODIUM-DEXTROSE 2-3 GM-% IV SOLR
2.0000 g | INTRAVENOUS | Status: AC
Start: 2013-12-15 — End: 2013-12-15
  Administered 2013-12-15: 2 g via INTRAVENOUS

## 2013-12-15 MED ORDER — MEPERIDINE HCL 50 MG/ML IJ SOLN: 6.2500 mg | INTRAMUSCULAR | Status: DC | PRN

## 2013-12-15 MED ORDER — GLYCOPYRROLATE 0.2 MG/ML IJ SOLN
INTRAMUSCULAR | Status: AC
  Filled 2013-12-15: qty 2

## 2013-12-15 MED ORDER — GLYCOPYRROLATE 0.2 MG/ML IJ SOLN
INTRAMUSCULAR | Status: DC | PRN
  Administered 2013-12-15: 0.4 mg via INTRAVENOUS

## 2013-12-15 MED ORDER — HYDROMORPHONE HCL PF 1 MG/ML IJ SOLN
0.2500 mg | INTRAMUSCULAR | Status: DC | PRN
  Administered 2013-12-15 (×2): 0.5 mg via INTRAVENOUS

## 2013-12-15 MED ORDER — CEFAZOLIN SODIUM-DEXTROSE 2-3 GM-% IV SOLR: 2.0000 g | INTRAVENOUS | Status: DC

## 2013-12-15 MED ORDER — METOCLOPRAMIDE HCL 5 MG/ML IJ SOLN
INTRAMUSCULAR | Status: DC | PRN
  Administered 2013-12-15: 10 mg via INTRAVENOUS

## 2013-12-15 MED ORDER — LIDOCAINE HCL (CARDIAC) 20 MG/ML IV SOLN
INTRAVENOUS | Status: AC
  Filled 2013-12-15: qty 5

## 2013-12-15 MED ORDER — SODIUM CHLORIDE 0.9 % IV SOLN
INTRAVENOUS | Status: DC
  Administered 2013-12-15 – 2013-12-16 (×2): via INTRAVENOUS

## 2013-12-15 MED ORDER — LACTATED RINGERS IV SOLN
INTRAVENOUS | Status: DC | PRN
  Administered 2013-12-15: 11:00:00 via INTRAVENOUS

## 2013-12-15 MED ORDER — LIDOCAINE HCL (CARDIAC) 20 MG/ML IV SOLN
INTRAVENOUS | Status: DC | PRN
  Administered 2013-12-15: 80 mg via INTRAVENOUS

## 2013-12-15 MED ORDER — OXYCODONE-ACETAMINOPHEN 5-325 MG PO TABS
1.0000 | ORAL_TABLET | ORAL | Status: DC | PRN
  Administered 2013-12-16: 2 via ORAL
  Filled 2013-12-15: qty 2

## 2013-12-15 MED ORDER — FENTANYL CITRATE 0.05 MG/ML IJ SOLN
INTRAMUSCULAR | Status: AC | PRN
  Administered 2013-12-15: 100 ug via INTRAVENOUS
  Administered 2013-12-15 (×3): 50 ug via INTRAVENOUS

## 2013-12-15 MED ORDER — MIDAZOLAM HCL 2 MG/2ML IJ SOLN
INTRAMUSCULAR | Status: AC
  Filled 2013-12-15: qty 6

## 2013-12-15 MED ORDER — MIDAZOLAM HCL 2 MG/2ML IJ SOLN
INTRAMUSCULAR | Status: AC
  Filled 2013-12-15: qty 2

## 2013-12-15 MED ORDER — IOHEXOL 300 MG/ML  SOLN
INTRAMUSCULAR | Status: DC | PRN
  Administered 2013-12-15: 7 mL

## 2013-12-15 MED ORDER — ROCURONIUM BROMIDE 100 MG/10ML IV SOLN
INTRAVENOUS | Status: DC | PRN
  Administered 2013-12-15: 20 mg via INTRAVENOUS
  Administered 2013-12-15: 10 mg via INTRAVENOUS

## 2013-12-15 MED ORDER — PROPOFOL 10 MG/ML IV BOLUS
INTRAVENOUS | Status: AC
  Filled 2013-12-15: qty 20

## 2013-12-15 MED ORDER — HYDROMORPHONE HCL PF 1 MG/ML IJ SOLN
0.5000 mg | INTRAMUSCULAR | Status: DC | PRN
  Administered 2013-12-15 – 2013-12-16 (×4): 1 mg via INTRAVENOUS
  Filled 2013-12-15 (×4): qty 1

## 2013-12-15 MED ORDER — ONDANSETRON HCL 4 MG/2ML IJ SOLN
INTRAMUSCULAR | Status: DC | PRN
  Administered 2013-12-15: 4 mg via INTRAVENOUS

## 2013-12-15 SURGICAL SUPPLY — 58 items
APL ESCP 34 STRL LF DISP (HEMOSTASIS)
APL SKNCLS STERI-STRIP NONHPOA (GAUZE/BANDAGES/DRESSINGS) ×2
APPLICATOR SURGIFLO ENDO (HEMOSTASIS) IMPLANT
BAG URINE DRAINAGE (UROLOGICAL SUPPLIES) ×3 IMPLANT
BAG URO CATCHER STRL LF (DRAPE) ×1 IMPLANT
BASKET ZERO TIP NITINOL 2.4FR (BASKET) IMPLANT
BENZOIN TINCTURE PRP APPL 2/3 (GAUZE/BANDAGES/DRESSINGS) ×3 IMPLANT
BLADE SURG 15 STRL LF DISP TIS (BLADE) ×2 IMPLANT
BLADE SURG 15 STRL SS (BLADE) ×3
BSKT STON RTRVL ZERO TP 2.4FR (BASKET)
CATCHER STONE W/TUBE ADAPTER (UROLOGICAL SUPPLIES) IMPLANT
CATH BEACON 5.038 65CM KMP-01 (CATHETERS) ×1 IMPLANT
CATH DILATION NEPHR BALL 15X10 (CATHETERS) ×1 IMPLANT
CATH FOLEY 2W COUNCIL 20FR 5CC (CATHETERS) IMPLANT
CATH FOLEY 2W COUNCIL 5CC 18FR (CATHETERS) ×4 IMPLANT
CATH FOLEY 2WAY SLVR  5CC 18FR (CATHETERS) ×1
CATH FOLEY 2WAY SLVR 5CC 18FR (CATHETERS) ×2 IMPLANT
CATH X-FORCE N30 NEPHROSTOMY (TUBING) ×4 IMPLANT
COVER SURGICAL LIGHT HANDLE (MISCELLANEOUS) ×3 IMPLANT
DRAPE C-ARM 42X120 X-RAY (DRAPES) ×3 IMPLANT
DRAPE CAMERA CLOSED 9X96 (DRAPES) ×3 IMPLANT
DRAPE LINGEMAN PERC (DRAPES) ×3 IMPLANT
DRAPE SURG IRRIG POUCH 19X23 (DRAPES) ×3 IMPLANT
DRSG PAD ABDOMINAL 8X10 ST (GAUZE/BANDAGES/DRESSINGS) ×6 IMPLANT
DRSG TEGADERM 8X12 (GAUZE/BANDAGES/DRESSINGS) ×6 IMPLANT
FIBER LASER FLEXIVA 200 (UROLOGICAL SUPPLIES) IMPLANT
FIBER LASER FLEXIVA 365 (UROLOGICAL SUPPLIES) IMPLANT
FIBER LASER FLEXIVA 550 (UROLOGICAL SUPPLIES) ×1 IMPLANT
FLOSEAL 10ML (HEMOSTASIS) IMPLANT
GAUZE SPONGE 4X4 12PLY STRL (GAUZE/BANDAGES/DRESSINGS) ×3 IMPLANT
GAUZE SPONGE 4X4 16PLY XRAY LF (GAUZE/BANDAGES/DRESSINGS) ×1 IMPLANT
GLOVE BIOGEL M 8.0 STRL (GLOVE) ×9 IMPLANT
GLOVE SURG SS PI 8.0 STRL IVOR (GLOVE) ×2 IMPLANT
GOWN STRL REUS W/ TWL XL LVL3 (GOWN DISPOSABLE) ×2 IMPLANT
GOWN STRL REUS W/TWL XL LVL3 (GOWN DISPOSABLE) ×5 IMPLANT
GUIDEWIRE AMPLAZ .035X145 (WIRE) ×4 IMPLANT
GUIDEWIRE STR DUAL SENSOR (WIRE) IMPLANT
HOLDER FOLEY CATH W/STRAP (MISCELLANEOUS) ×3 IMPLANT
KIT BASIN OR (CUSTOM PROCEDURE TRAY) ×3 IMPLANT
MANIFOLD NEPTUNE II (INSTRUMENTS) ×3 IMPLANT
MARKER SKIN DUAL TIP RULER LAB (MISCELLANEOUS) ×2 IMPLANT
NS IRRIG 1000ML POUR BTL (IV SOLUTION) ×3 IMPLANT
PACK BASIC VI WITH GOWN DISP (CUSTOM PROCEDURE TRAY) ×3 IMPLANT
PACK CYSTO (CUSTOM PROCEDURE TRAY) ×1 IMPLANT
PROBE LITHOCLAST ULTRA 3.8X403 (UROLOGICAL SUPPLIES) ×1 IMPLANT
PROBE PNEUMATIC 1.0MMX570MM (UROLOGICAL SUPPLIES) ×1 IMPLANT
SET IRRIG Y TYPE TUR BLADDER L (SET/KITS/TRAYS/PACK) ×3 IMPLANT
SET WARMING FLUID IRRIGATION (MISCELLANEOUS) IMPLANT
STENT CONTOUR 6FRX24X.038 (STENTS) ×1 IMPLANT
STONE CATCHER W/TUBE ADAPTER (UROLOGICAL SUPPLIES) ×6 IMPLANT
SUT MNCRL AB 4-0 PS2 18 (SUTURE) IMPLANT
SUT SILK 2 0 30  PSL (SUTURE) ×1
SUT SILK 2 0 30 PSL (SUTURE) IMPLANT
SYRINGE 12CC LL (MISCELLANEOUS) ×3 IMPLANT
SYRINGE 20CC LL (MISCELLANEOUS) ×6 IMPLANT
TOWEL OR NON WOVEN STRL DISP B (DISPOSABLE) ×3 IMPLANT
TRAY FOLEY CATH 14FRSI W/METER (CATHETERS) ×2 IMPLANT
TUBING CONNECTING 10 (TUBING) ×6 IMPLANT

## 2013-12-15 NOTE — H&P (Signed)
Agree.  For right perc nephrolithotomy today after perc access.

## 2013-12-15 NOTE — H&P (Signed)
History of Present Illnes  [name] [name] has a long history of nephrolithiasis treated her with multimodality therapy including numerous surgeries, captopril, water gluttony and medical expulsion therapy for her cystine stones. She underwent cysto, ureteroscopy, stone extraction, and stent placement for symptomatic left ureteral stones on 09/25/13. She did very well post operative. She is scheduled to return to the OR on 12/15/13 for a percutaneous nephrolithotomy for a large right staghorn stone. She presents today for a pre op follow up. She is doing very well. She has had no pain from this known remaining stone.  She denies any bothersome LUTS, dysuria, or hematuria. She has not had a fever. She is without complaints today.   Past Medical History Problems  1. History of hypertension (V12.59) 2. History of Murmur (785.2)  Surgical History Problems  1. History of Cystoscopy With Insertion Of Ureteral Stent Bilateral 2. History of Cystoscopy With Insertion Of Ureteral Stent Left 3. History of Cystoscopy With Insertion Of Ureteral Stent Right 4. History of Cystoscopy With Pyeloscopy With Lithotripsy 5. History of Cystoscopy With Pyeloscopy With Removal Of Calculus 6. History of Cystoscopy With Ureteroscopy Right 7. History of Cystoscopy With Ureteroscopy With Lithotripsy 8. History of Lithotripsy 9. History of Percutaneous Lithotomy 10. History of Percutaneous Lithotomy  Current Meds 1. Metoprolol Succinate ER 50 MG Oral Tablet Extended Release 24 Hour;  Therapy: 12Dec2014 to Recorded  Allergies Medication  1. Bactrim TABS 2. Demerol TABS 3. Morphine Derivatives  Family History Problems  1. Family history of Death In The Family Father : Father   age 36; car accident 2. Family history of Family Health Status Number Of Children   1 son 3. Family history of Urologic Disorder (V18.7)  Social History Problems  1. Denied: Alcohol Use 2. Caffeine Use 3. Family history of Death In  The Family Father 4. Marital History - Currently Married 5. Never a smoker 6. Occupation:   Estate agent 7. Denied: Tobacco Use (V15.82)  Review of Systems Genitourinary, constitutional, skin, eye, otolaryngeal, hematologic/lymphatic, cardiovascular, pulmonary, endocrine, musculoskeletal, gastrointestinal, neurological and psychiatric system(s) were reviewed and pertinent findings if present are noted.  Genitourinary: dysuria, hematuria and cloudy urine.  Gastrointestinal: nausea.    Vitals Vital Signs  Height: 5 ft 4 in Weight: 134 lb  BMI Calculated: 23 BSA Calculated: 1.65 Blood Pressure: 149 / 77 Temperature: 97.6 F Heart Rate: 60  Physical Exam Constitutional: Well nourished and well developed . No acute distress.   ENT:. The ears and nose are normal in appearance.   Neck: The appearance of the neck is normal and no neck mass is present.   Pulmonary: No respiratory distress and normal respiratory rhythm and effort.   Cardiovascular:. No obvious murmurs are appreciated.   Abdomen: No suprapubic tenderness. No right CVA tenderness and left CVA tenderness.   Lymphatics: The posterior cervical, anterior cervical and supraclavicular nodes are not enlarged or tender.   Skin: Normal skin turgor, no visible rash and no visible skin lesions.   Neuro/Psych:. Mood and affect are appropriate.     Assessment   [name] [name] is doing well and is ready for her scheduled procedure. Her urine is clear and a preop urine culture was negative.  She has a large 20 mm stone in the upper pole of her right kidney with a 6 mm stone in the lower pole as well.  We have discussed the treatment options and she has elected to proceed with a percutaneous nephrolithotomy.  The risks, complications, anticipated postoperative course  and probability of success were discussed again with her and she has elected to proceed.    Plan  She is scheduled for a right percutaneous nephrolithotomy.

## 2013-12-15 NOTE — Anesthesia Postprocedure Evaluation (Signed)
Anesthesia Post Note  Patient: [name] [name]  Procedure(s) Performed: Procedure(s) (LRB): RIGHT PERCUTANEOUS NEPHROLITHOTOMY  (Right) HOLMIUM LASER APPLICATION (N/A)  Anesthesia type: General  Patient location: PACU  Post pain: Pain level controlled  Post assessment: Post-op Vital signs reviewed  Last Vitals: BP 101/52  Pulse 54  Temp(Src) 36.4 C (Oral)  Resp 16  Ht 5\' 4"  (1.626 m)  Wt 143 lb (64.864 kg)  BMI 24.53 kg/m2  SpO2 100%  LMP 11/26/2013  Post vital signs: Reviewed  Level of consciousness: sedated  Complications: No apparent anesthesia complications

## 2013-12-15 NOTE — Anesthesia Preprocedure Evaluation (Addendum)
Anesthesia Evaluation  Patient identified by MRN, date of birth, ID band Patient awake, Patient confused and Patient unresponsive    Reviewed: Allergy & Precautions, H&P , NPO status , Patient's Chart, lab work & pertinent test results, reviewed documented beta blocker date and time   History of Anesthesia Complications (+) PONV, AWARENESS UNDER ANESTHESIA and history of anesthetic complications  Airway Mallampati: II TM Distance: >3 FB Neck ROM: Full    Dental no notable dental hx.    Pulmonary neg pulmonary ROS,  breath sounds clear to auscultation  Pulmonary exam normal       Cardiovascular Exercise Tolerance: Good hypertension, Pt. on home beta blockers negative cardio ROS  + dysrhythmias Supra Ventricular Tachycardia Rhythm:Regular Rate:Normal     Neuro/Psych negative neurological ROS  negative psych ROS   GI/Hepatic negative GI ROS, Neg liver ROS,   Endo/Other  negative endocrine ROS  Renal/GU Renal disease     Musculoskeletal negative musculoskeletal ROS (+)   Abdominal   Peds  Hematology negative hematology ROS (+)   Anesthesia Other Findings   Reproductive/Obstetrics negative OB ROS                        Anesthesia Physical Anesthesia Plan  ASA: II  Anesthesia Plan: General   Post-op Pain Management:    Induction: Intravenous  Airway Management Planned: Oral ETT  Additional Equipment:   Intra-op Plan:   Post-operative Plan: Extubation in OR  Informed Consent: I have reviewed the patients History and Physical, chart, labs and discussed the procedure including the risks, benefits and alternatives for the proposed anesthesia with the patient or authorized representative who has indicated his/her understanding and acceptance.   Dental advisory given  Plan Discussed with: CRNA  Anesthesia Plan Comments:       Anesthesia Quick Evaluation                                   Anesthesia Evaluation  Patient identified by MRN, date of birth, ID band Patient awake    Reviewed: Allergy & Precautions, H&P , NPO status , Patient's Chart, lab work & pertinent test results, reviewed documented beta blocker date and time   History of Anesthesia Complications (+) PONV and history of anesthetic complications  Airway Mallampati: II TM Distance: >3 FB Neck ROM: full    Dental no notable dental hx. (+) Teeth Intact, Dental Advisory Given   Pulmonary neg pulmonary ROS,  breath sounds clear to auscultation  Pulmonary exam normal       Cardiovascular Exercise Tolerance: Good hypertension, Pt. on home beta blockers + dysrhythmias Supra Ventricular Tachycardia Rhythm:regular Rate:Normal  PSVT Took 09-24-13   Neuro/Psych negative neurological ROS  negative psych ROS   GI/Hepatic negative GI ROS, Neg liver ROS,   Endo/Other  negative endocrine ROSBenign goiter  Renal/GU negative Renal ROS  negative genitourinary   Musculoskeletal   Abdominal   Peds  Hematology negative hematology ROS (+)   Anesthesia Other Findings Prosthetic right arm  Reproductive/Obstetrics negative OB ROS                      Anesthesia Physical Anesthesia Plan  ASA: II  Anesthesia Plan: General   Post-op Pain Management:    Induction: Intravenous  Airway Management Planned: Oral ETT  Additional Equipment:   Intra-op Plan:   Post-operative Plan:   Informed Consent: I have  reviewed the patients History and Physical, chart, labs and discussed the procedure including the risks, benefits and alternatives for the proposed anesthesia with the patient or authorized representative who has indicated his/her understanding and acceptance.   Dental Advisory Given  Plan Discussed with: CRNA and Surgeon  Anesthesia Plan Comments:        Anesthesia Quick Evaluation

## 2013-12-15 NOTE — Op Note (Signed)
PATIENT:  [name] [name]  PRE-OPERATIVE DIAGNOSIS: 2.5 cm right upper pole staghorn calculus  POST-OPERATIVE DIAGNOSIS: Same  PROCEDURE:1.  Right percutaneous nephrolithotomy 2.  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  She has redeveloped a large stone in the upper pole of her right kidney as well as a smaller stone in the midpole.  We have discussed the treatment options and she has undergone percutaneous nephrolithotomy several times in the past.  She has elected to proceed with this form of therapy  ANESTHESIA:  General  EBL:  500 cc  DRAINS: 1.  18 French Foley catheter  2. 6 French, 24 cm double-J stent in the right ureter. 3. Two 18 French Foley catheters as nephrostomy tubes  LOCAL MEDICATIONS USED:  None  SPECIMEN:  Stone given the patient  Description of procedure: After informed consent the patient was taken to the operating room and placed on the table in a supine position. General anesthesia was then administered. Once she was under full general anesthesia she was moved to the prone position. An official timeout was then performed.  I first passed a guidewire down through the nephrostomy catheter that had entered the lower pole calyx.  This was done under fluoroscopy.  I then removed the nephrostomy catheter and replace this with a coaxial catheter.  The inner portion of the coaxial catheter was removed and a second guidewire was then passed down the ureter under fluoroscopy into the bladder.  The second guidewire was used as a safety wire after I removed the coaxial catheter.  A transverse incision was then made at the level of the guidewire and I then advanced the UroMax nephrostomy dilating balloon over the guidewire and into the area of the renal pelvis under fluoroscopy.  The balloon was inflated and a 30 French nephrostomy sheath was then passed over the  balloon into the area of the renal pelvis through the lower pole.  Lower pole access was attained first because the guidewire was able to pass the stone through the upper pole access but it was not felt that I could get the balloon or nephrostomy sheath into the upper pole calyx because it was completely filled with stone material.  I used the 26 French rigid nephroscope and was able to visualize the stone.  I used the lithotrite to fragment as much of the stone as possible but I was having difficulty accessing all the stone.  I switched to a 500  holmium laser fiber and was able to fragment the stone further but still could not access enough of the stone to get it broken enough to pull it down from the upper pole calyx where it could be managed in the renal pelvis.  I therefore to remove all fragments that were accessible with the 2 prong grasper and elected to proceed with a second access site now that I had cleared out enough stone in order to place a nephrostomy access sheath.  I performed an identical procedure and obtaining access for the nephrostomy sheath as described above.  Rigid nephroscopy was then performed and I could clearly see the stone and was able to fragment it further.  I was able to remove all visible fragments and then inspected the kidney both from the upper and lower access sites and could not identify any further stone however there was a moderate amount of bleeding and clots were  present which could be obscuring stone fragments.  I elected to place nephrostomy tubes in both of the access sites but I first passed my rigid nephroscope over the working guidewire in the upper pole and passed the double-J stent over the guidewire.  As I removed the guidewire good curl was noted in the bladder fluoroscopically and good curl was noted in the renal pelvis visually.  I therefore placed an 6018 French catheter as a nephrostomy tube in both of the tracts and removed the access sheaths.  These were  secured to the skin with 2-0 nylon sutures and a sterile occlusive dressing was applied.  Both nephrostomy tubes were connected to closed system drainage and the patient was awakened and taken to recovery room in stable and satisfactory condition.  She tolerated the procedure well with no intraoperative complications.  PLAN OF CARE: Discharge to home after PACU  PATIENT DISPOSITION:  PACU - hemodynamically stable.

## 2013-12-15 NOTE — Discharge Instructions (Signed)

## 2013-12-15 NOTE — H&P (Signed)
[name] [name] is an 48 y.o. female.   Chief Complaint: kidney stones [id number] with history of nephrolithiasis and now with large 20 mm upper and  6 mm lower pole right renal stones presents today for right percutaneous nephrostomy prior to nephrolithotomy .    Past Medical History  Diagnosis Date  . Hypertension   . PSVT (paroxysmal supraventricular tachycardia)     CARDIOLOGIST --  DR CRENSHAW  "NO PROBLEMS IN LONG TIME" WITH FAST HEART RATE  . History of kidney stones     CYSTINE STONES  . Multiple thyroid nodules     W/ GOITER--  LAST BX  BENIGN  . Congenital absence of right upper extremity     WEARS PROSTHESIS  RIGHT ARM  . Staghorn calculus     RIGHT  . PONV (postoperative nausea and vomiting)     PT STATES ZOFRAN IN SURGERY HELPS    Past Surgical History  Procedure Laterality Date  . Percutaneous nephrostolithotomy Bilateral LEFT  05-30-2002/   RIGHT 12-13-1999  . Right ureteroscopic stone extraction / stent placement  07-03-2000  . Cysto/  right ureteral stent placement  07-10-2000  . Cytso/  left ureteral stent placement  04-12-2006  &  06-05-2002  . Cysto/  right retrograde pyelogram/ right ureteroscopy  02-10-2005/   02-17-2005/   03-18-2001    02-17-2005-- STENT PLACEMENT  . Dilation and curettage of uterus  04-09-2004    W/  SUCTION  . Cysto/  bilateral ureteroscopic laser lithotripsy/  bilateral ureteral stone extraction/  bilateral stent placement  01-22-2008  . Cystoscopy with retrograde pyelogram, ureteroscopy and stent placement Left 09/25/2013    Procedure: LEFT RETROGRADE PYELOGRAM, URETEROSCOPY LASER LITHO AND STENT PLACEMENT;  Surgeon: Garnett Farm, MD;  Location: [facility];  Service: Urology;  Laterality: Left;  . Holmium laser application Left 09/25/2013    Procedure: HOLMIUM LASER APPLICATION;  Surgeon: Garnett Farm, MD;  Location: [facility];  Service: Urology;  Laterality: Left;    Family History  Problem  Relation Age of Onset  . Healthy Mother   . Healthy Brother    Social History:  reports that she has never smoked. She has never used smokeless tobacco. She reports that she drinks alcohol. She reports that she does not use illicit drugs.  Allergies:  Allergies  Allergen Reactions  . Demerol [Meperidine Hcl] Nausea And Vomiting  . Morphine Nausea And Vomiting  . Bactrim [Sulfamethoxazole-Tmp Ds] Hives    Medications Prior to Admission  Medication Sig Dispense Refill  . metoprolol succinate (TOPROL-XL) 50 MG 24 hr tablet Take 1 tablet (50 mg total) by mouth at bedtime. Take with or immediately following a meal.  30 tablet  11    Results for orders placed during the hospital encounter of 12/15/13 (from the past 48 hour(s))  ABO/RH     Status: None   Collection Time    12/15/13  8:00 AM      Result Value Ref Range   ABO/RH(D) O NEG     Results for orders placed during the hospital encounter of 12/15/13  ABO/RH      Result Value Ref Range   ABO/RH(D) O NEG     LABS 12/15/13:   PT 12.5  INR 0.93     12/04/13  WBC 6.4  HGB 14.6  PLTS 271K  BUN 15  CREAT 0.63 Review of Systems  Constitutional: Negative for fever and chills.  Respiratory: Negative for cough, hemoptysis and shortness  of breath.   Cardiovascular: Negative for chest pain.  Gastrointestinal: Negative for nausea, vomiting, abdominal pain and blood in stool.  Genitourinary: Negative for dysuria, hematuria and flank pain.  Musculoskeletal: Negative for back pain.  Neurological: Negative for headaches.  Endo/Heme/Allergies: Does not bruise/bleed easily.    Last menstrual period 11/26/2013. Physical Exam  Constitutional: She is oriented to person, place, and time. She appears well-developed and well-nourished.  Cardiovascular: Normal rate and regular rhythm.   Respiratory: Effort normal and breath sounds normal.  GI: Soft. Bowel sounds are normal. There is no tenderness.  Musculoskeletal: Normal range of motion. She  exhibits no edema.  Neurological: She is alert and oriented to person, place, and time.     Assessment/Plan Patient with history of nephrolithiasis and now with large 20 mm upper and  6 mm lower pole right renal stones presents today for right percutaneous nephrostomy prior to nephrolithotomy . Details/risks of procedure d/w pt/family with their understanding and consent.    Xavier Fournier,D KEVIN 12/15/2013, 9:23 AM

## 2013-12-15 NOTE — Procedures (Signed)
Procedure:  Percutaneous ureteral catheter placement Findings:  Staghorn calculus predominantly in upper pole collecting system.  Small calculus in LP calyx. Upper pole access at base of dominant calculus performed.  LP access via LP infundibulum. 5 Fr catheters advanced via each access into bladder. For OR case to follow with Dr. Vernie Ammonsttelin.

## 2013-12-15 NOTE — Transfer of Care (Signed)
Immediate Anesthesia Transfer of Care Note  Patient: [name] [name]  Procedure(s) Performed: Procedure(s): RIGHT PERCUTANEOUS NEPHROLITHOTOMY  (Right) HOLMIUM LASER APPLICATION (N/A)  Patient Location: PACU  Anesthesia Type:General  Level of Consciousness: awake, alert , oriented and patient cooperative  Airway & Oxygen Therapy: Patient Spontanous Breathing and Patient connected to face mask oxygen  Post-op Assessment: Report given to PACU RN, Post -op Vital signs reviewed and stable and Patient moving all extremities  Post vital signs: Reviewed and stable  Complications: No apparent anesthesia complications

## 2013-12-16 ENCOUNTER — Encounter (HOSPITAL_COMMUNITY): Payer: Self-pay | Admitting: Urology

## 2013-12-16 DIAGNOSIS — N200 Calculus of kidney: Secondary | ICD-10-CM | POA: Diagnosis not present

## 2013-12-16 LAB — HEMOGLOBIN AND HEMATOCRIT, BLOOD
HCT: 25.7 % — ABNORMAL LOW (ref 36.0–46.0)
Hemoglobin: 8.9 g/dL — ABNORMAL LOW (ref 12.0–15.0)

## 2013-12-16 MED ORDER — HYDROMORPHONE HCL 4 MG PO TABS: 4.0000 mg | ORAL_TABLET | ORAL | Status: DC | PRN

## 2013-12-16 MED ORDER — ONDANSETRON HCL 4 MG PO TABS
4.0000 mg | ORAL_TABLET | Freq: Once | ORAL | Status: AC
  Administered 2013-12-16: 4 mg via ORAL
  Filled 2013-12-16: qty 1

## 2013-12-16 NOTE — Discharge Summary (Signed)
Physician Discharge Summary  Patient ID: [name] [name] MRN: [id number] DOB/AGE: 1965-05-15 48 y.o.  Admit date: 12/15/2013 Discharge date: 12/16/2013  Admission Diagnoses: 1. Right partial staghorn calculus 2.  Cystinuria  Discharge Diagnoses:  1. Right partial staghorn calculus (>  2 cm) 2.  Cystinuria 3.  Acute Operative blood loss Anemia  Discharged Condition: good  Hospital Course: [name] [name] has congenital cystinuria which results in significant calculus formation.  She was recently found to have a large staghorn calculus filling approximately half of her right kidney involving the upper portion with a second stone in the midpole region.  She is brought into the hospital for percutaneous nephrostomy tube placement followed by PCNL.  The stone field the majority of the collecting system in the upper pole and therefore an initial nephrostomy catheter placement in the upper pole revealed there was no room within the collecting system to allow the system to be entered with a dilating balloon and access sheath so a second access was placed in the lower pole.  She was taken to the operating room and I initially was able to dilate the lower pole access and visualize the stone and was able to remove a portion but not all of the stone but this did allow room in the upper pole collecting system for me to then dilate and place an access sheath and removed the remaining portions of the stone.  There was a moderate amount of intraoperative bleeding and therefore nephrostomy tubes were left in both the upper and lower access sites.  I also placed a stent and postoperatively she was doing well.  Her hemoglobin had dropped somewhat but she was not tachycardic or orthostatic.  The night of her surgery she was resting comfortably with no significant pain and was not having significant blood from either her nephrostomy tubes or catheter.  The following morning she was doing well.  Examination revealed no flank  ecchymoses or abdominal tenderness.  Her tubes were clamped and she tolerated having both of these clamped.  I therefore removed the tubes and applied a sterile dressing.  Her hemoglobin was slightly lower felt to be secondary to some dilution but she did not seem symptomatic in any way with no tachycardia or hypotension.  I therefore discussed with her the need to increase her iron intake to build up her blood counts naturally as I did not feel a transfusion was necessary.  Her postoperative CT scan revealed a few small fragments remaining but otherwise the system had been cleared nicely.  I told her I would remove her nephrostomy tubes and then her stent in the office. she is felt ready for discharge at this time.  Discharge Exam: Blood pressure 102/62, pulse 74, temperature 97.4 F (36.3 C), temperature source Oral, resp. rate 16, height 5\' 4"  (1.626 m), weight 64.864 kg (143 lb), last menstrual period 11/26/2013, SpO2 97.00%. General appearance: alert, cooperative and no distress GI: normal findings: nontender, nondistended with no mass palpable. Incision/Wound: her nephrostomy sites had no evidence of bleeding or drainage.  There were no flank ecchymoses.  Disposition: 01-Home or Self Care  Discharge Instructions   Discharge patient    Complete by:  As directed      Foley catheter - discontinue    Complete by:  As directed             Medication List         HYDROmorphone 4 MG tablet  Commonly known as:  DILAUDID  Take 1  tablet (4 mg total) by mouth every 4 (four) hours as needed.     metoprolol succinate 50 MG 24 hr tablet  Commonly known as:  TOPROL-XL  Take 1 tablet (50 mg total) by mouth at bedtime. Take with or immediately following a meal.           Follow-up Information   Follow up with Garnett Farm, MD On 12/22/2013. (at 1:45)    Specialty:  Urology   Contact information:   [address] [place] [address] [phone]       Signed: Garnett Farm 12/16/2013, 7:01 AM

## 2013-12-23 ENCOUNTER — Telehealth: Payer: Self-pay | Admitting: Cardiology

## 2013-12-23 NOTE — Telephone Encounter (Signed)
Recent surgery for kidney stones.  Seen by Dr. Ronal Fearttlein yesterday - he wants her to start Captopril 50mg  daily. (decreases formation of kidney stones).  She is concerned about starting this because she is on Metoprolol 50mg  daily.  She doesn't think she can take both of these.  Requesting Dr. Jens Somrenshaw be incorporated in this decision.  Message sent to Dr. Jens Somrenshaw and [facility] for review and advise.

## 2013-12-23 NOTE — Telephone Encounter (Signed)
Toprol could be decreased to 25 mg daily; BP borderline at time of previous office visits; can captopril be started at lower dose such as 25 mg daily; would ask her to review with Dr Vernie Ammonsttelin. [name] [name]

## 2013-12-23 NOTE — Telephone Encounter (Signed)
New message          Pt would like to discuss a medication change

## 2013-12-24 NOTE — Telephone Encounter (Signed)
Spoke with pt, Aware of dr Ludwig Clarkscrenshaw's recommendations. Will forward for dr ottlein review.

## 2014-01-16 IMAGING — CR DG FOOT COMPLETE 3+V*L*
3 series · 3 of 3 positions shown · non-contrast
Comparison: None.

CLINICAL DATA: Injury

LEFT FOOT - COMPLETE 3+ VIEW

[t foot ap left]
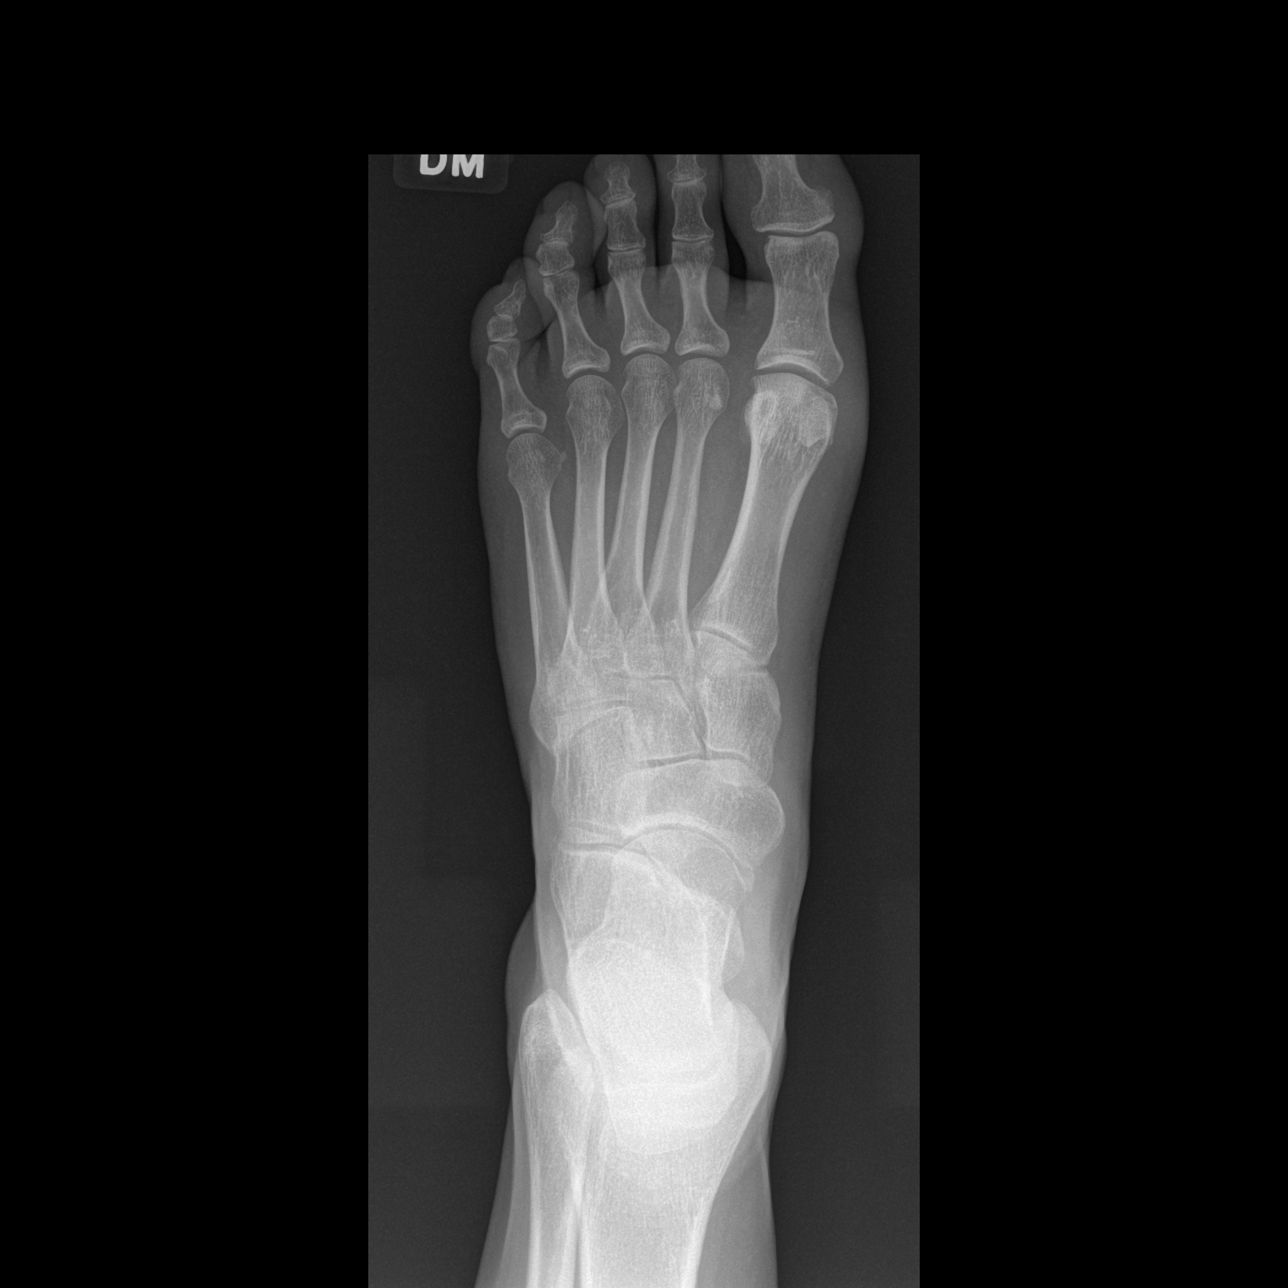

[t foot oblique left]
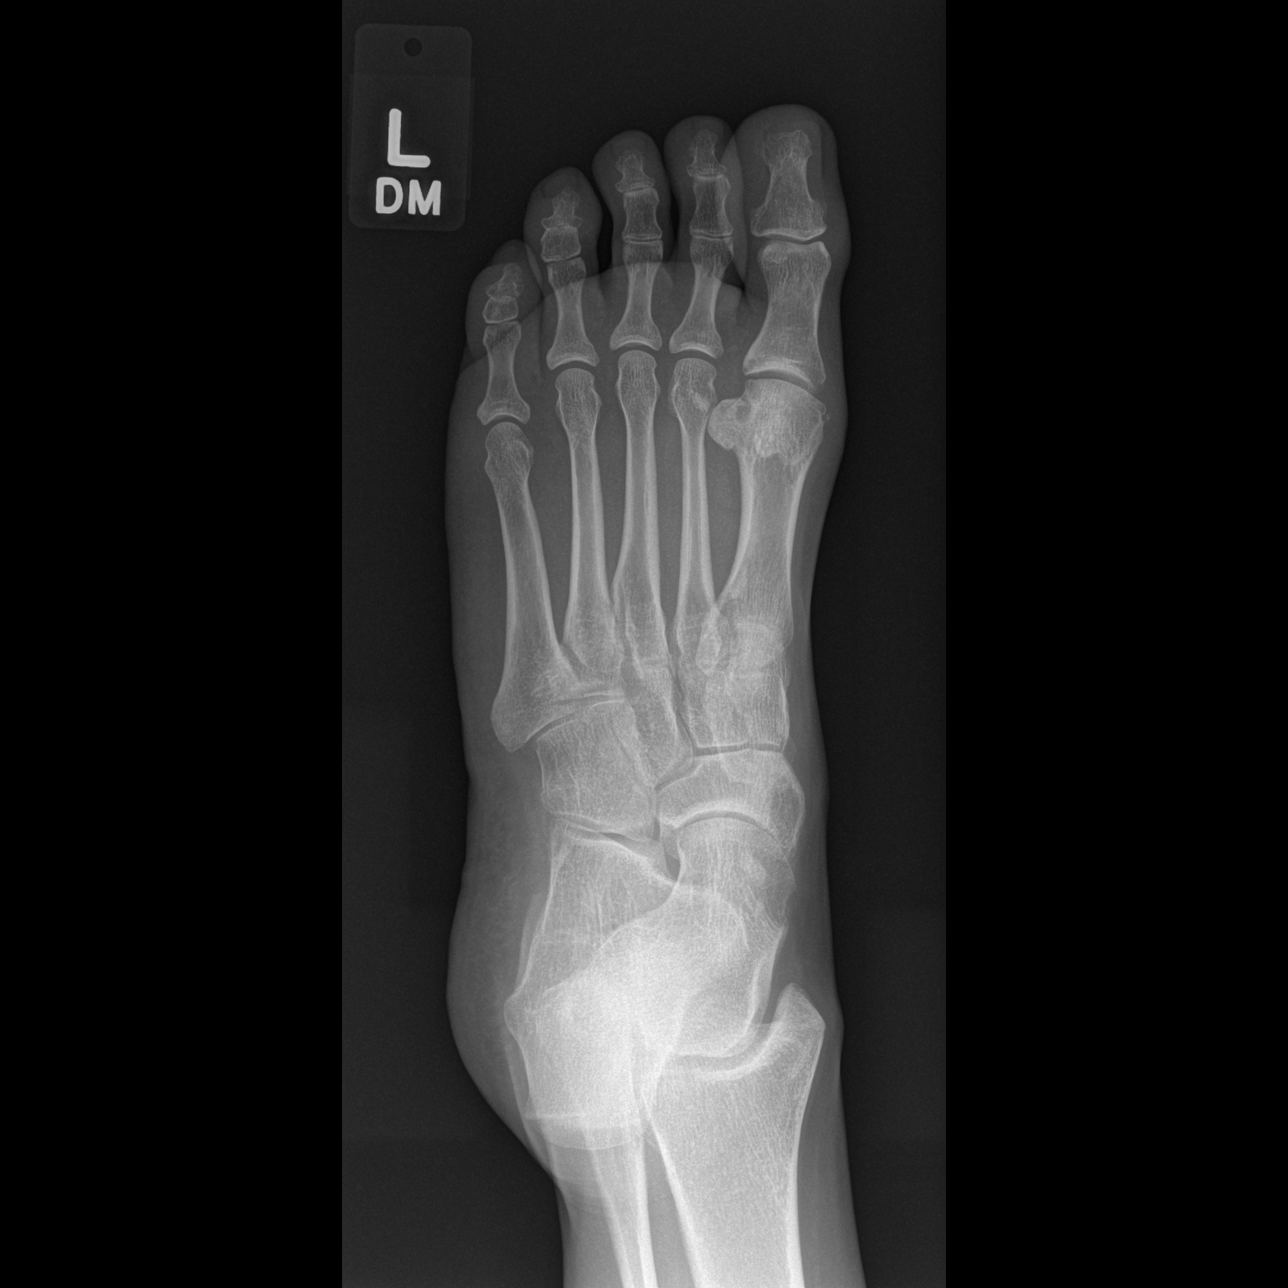

[t foot lat left]
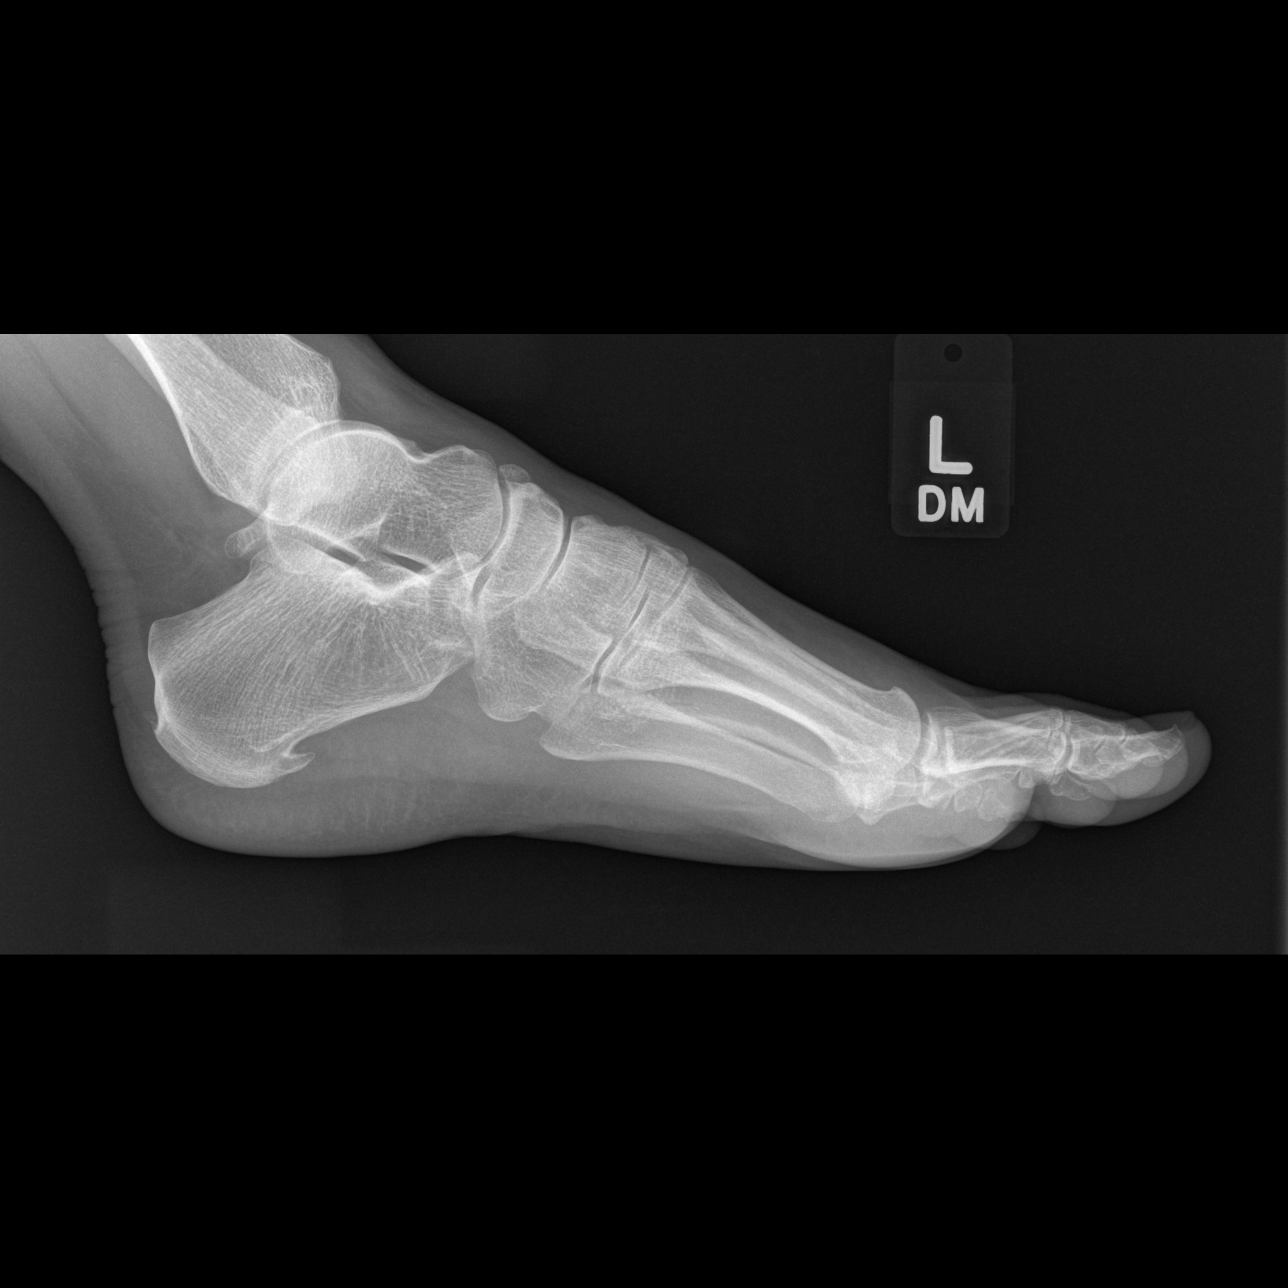

[3 of 3 positions shown; findings below may reference images not displayed]

FINDINGS: Mild degenerative change at the first metatarsophalangeal
joint.  Spurring at the inferior calcaneus is moderate.  Small bony
density dorsal to the navicular has a chronic appearance. Tiny bony
density adjacent to the head of the first metatarsal also has a
chronic appearance.  No acute fracture or dislocation.
IMPRESSION: No acute bony pathology.  Chronic changes.

## 2014-10-26 ENCOUNTER — Other Ambulatory Visit: Payer: Self-pay

## 2014-10-26 DIAGNOSIS — Z1231 Encounter for screening mammogram for malignant neoplasm of breast: Secondary | ICD-10-CM

## 2014-11-03 ENCOUNTER — Ambulatory Visit
Admission: RE | Admit: 2014-11-03 | Discharge: 2014-11-03 | Disposition: A | Discharge status: Home / Self Care | Payer: BLUE CROSS/BLUE SHIELD | Source: Ambulatory Visit

## 2014-11-03 DIAGNOSIS — Z1231 Encounter for screening mammogram for malignant neoplasm of breast: Secondary | ICD-10-CM

## 2014-12-18 ENCOUNTER — Other Ambulatory Visit: Payer: Self-pay | Admitting: Cardiology

## 2014-12-18 NOTE — Telephone Encounter (Signed)
REFILL 

## 2015-01-18 ENCOUNTER — Other Ambulatory Visit: Payer: Self-pay | Admitting: Cardiology

## 2015-01-30 IMAGING — US IR PERC INTRO URET CATH*R*
1 series · 2 of 2 positions shown · non-contrast
Comparison: CT on 09/23/2013.

CLINICAL DATA: Right staghorn renal calculus. Percutaneous access
require prior to operative nephrolithotomy.

EXAM:
1. ULTRASOUND GUIDANCE FOR PUNCTURE OF THE RIGHT RENAL COLLECTING
SYSTEM.
2. RIGHT PERCUTANEOUS URETERAL CATHETER PLACEMENT VIA UPPER POLE
ACCESS.
3. RIGHT PERCUTANEOUS URETERAL CATHETER PLACEMENT VIA LOWER POLE

[Series 1: ir perc intro uret cath*right* · 2 of 2 slices shown]
[im 1/2]
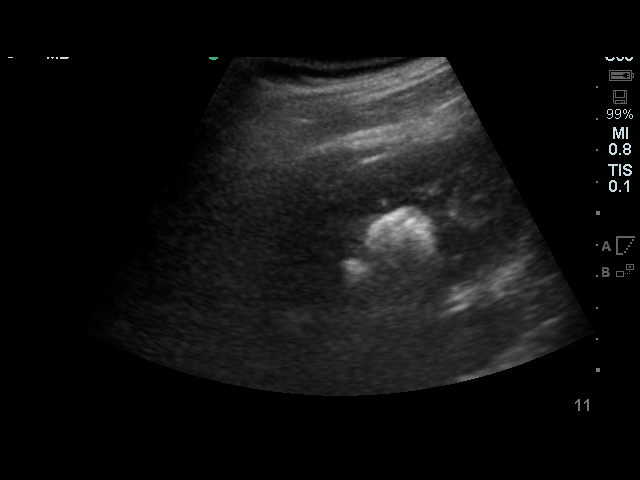
[im 2/2]
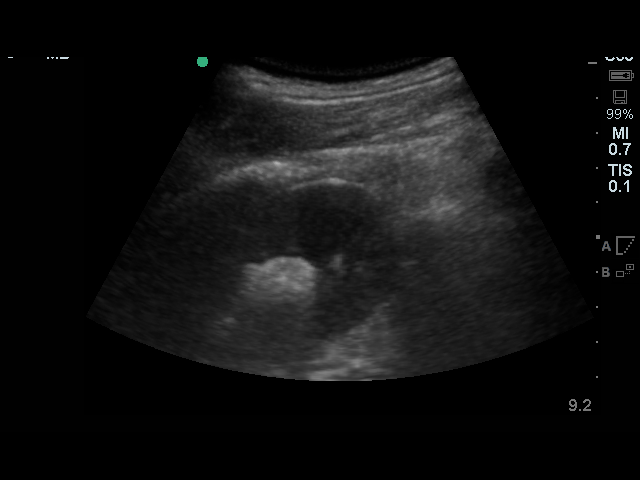

[2 of 2 positions shown; findings below may reference images not displayed]

ANESTHESIA/SEDATION:
5.0 mg IV Versed; 250 mcg IV Fentanyl.

Total Moderate Sedation Time

45 minutes

CONTRAST:  50 ml Omnipaque 300

MEDICATIONS:
2 g IV Ancef. As antibiotic prophylaxis, Ancef was ordered
pre-procedure and administered intravenously within one hour of
incision.

FLUOROSCOPY TIME:  13 minutes and 54 seconds.

PROCEDURE:
The procedure, risks, benefits, and alternatives were explained to
the patient. Questions regarding the procedure were encouraged and
answered. The patient understands and consents to the procedure.

The right flank region was prepped with Betadine in a sterile
fashion, and a sterile drape was applied covering the operative
field. A sterile gown and sterile gloves were used for the
procedure. Local anesthesia was provided with 1% Lidocaine.

Ultrasound was used to localize the right kidney. Under direct
ultrasound guidance, a 21 gauge needle was advanced into the renal
collecting system. Ultrasound image documentation was performed.
Aspiration of urine sample was performed followed by contrast
injection.

Additional fluoroscopically guided puncture of the upper pole
collecting system was performed with a 21 gauge needle. Contrast
injection was performed.

Access of the lower pole collecting system was then performed under
fluoroscopy with a 21 gauge needle. Contrast was injected under
fluoroscopy.

A guidewire was advanced via of the lower pole access needle under
fluoroscopy into the ureter. A transitional dilator was placed. A
standard guidewire was then advanced into the ureter. A 5 French
catheter was then advanced over the wire and into the bladder. This
catheter was capped.

A guidewire was then advanced via the upper pole percutaneous access
needle into the ureter. After placing a transitional dilator and
guidewire, a second 5 French catheter was advanced over this wire
and into the bladder. This catheter was capped.

Both ureteral catheters were secured at the skin with silk retention
sutures. A large overlying dressing was applied.

COMPLICATIONS:
None.
FINDINGS: Initial fluoroscopy demonstrates a large staghorn calculus in the
upper pole collecting system and a small calculus in the lower pole
collecting system. With initial use of ultrasound, secure access of
the right renal collecting system could not be accomplished. A small
amount of contrast material was able to be injected into the
collecting system after needle puncture under ultrasound guidance.

Under fluoroscopic guidance, puncture of the upper pole collecting
system was performed at the base of a staghorn calculus extending up
into the upper pole collecting system and extending the upper pole
collecting system. Given little purchase in the collecting system at
the level of the base of the stone, decision was made to suture a
second percutaneous access via of the lower pole. A lower pole
infundibulum was punctured under fluoroscopy.

Both upper pole and lower pole access was secured by advancing a
ureteral catheter into the bladder. Access will be used for
operative nephrolithotomy by Dr. Dinis.
IMPRESSION: Percutaneous ureteral access obtained for nephrolithotomy including
upper pole and lower pole access. Separate 5 French catheters were
advanced into the ureter and into the bladder.

## 2015-02-16 ENCOUNTER — Other Ambulatory Visit: Payer: Self-pay | Admitting: Cardiology

## 2015-02-16 NOTE — Telephone Encounter (Signed)
REFILL 

## 2015-03-09 NOTE — Progress Notes (Signed)
HPI: FU hypertension, SVT. According to previous notes when she was cared for by Dr Corinda GublerLeBauer, she has a history of SVT although I do not have actual strips. A previous echocardiogram in September 2003, showed normal LV function and a Myoview performed in September 2003, showed no scar or ischemia with an ejection fraction 74%. Since I last saw her, the patient denies any dyspnea on exertion, orthopnea, PND, pedal edema, palpitations, syncope or chest pain.   Current Outpatient Prescriptions  Medication Sig Dispense Refill  . metoprolol succinate (TOPROL-XL) 50 MG 24 hr tablet TAKE ONE TABLET AT BEDTIME 30 tablet 1   No current facility-administered medications for this visit.     Past Medical History  Diagnosis Date  . Hypertension   . PSVT (paroxysmal supraventricular tachycardia) (HCC)     CARDIOLOGIST --  DR CRENSHAW  "NO PROBLEMS IN LONG TIME" WITH FAST HEART RATE  . History of kidney stones     CYSTINE STONES  . Multiple thyroid nodules     W/ GOITER--  LAST BX  BENIGN  . Congenital absence of right upper extremity     WEARS PROSTHESIS  RIGHT ARM  . Staghorn calculus     RIGHT  . PONV (postoperative nausea and vomiting)     PT STATES ZOFRAN IN SURGERY HELPS    Past Surgical History  Procedure Laterality Date  . Percutaneous nephrostolithotomy Bilateral LEFT  05-30-2002/   RIGHT 12-13-1999  . Right ureteroscopic stone extraction / stent placement  07-03-2000  . Cysto/  right ureteral stent placement  07-10-2000  . Cytso/  left ureteral stent placement  04-12-2006  &  06-05-2002  . Cysto/  right retrograde pyelogram/ right ureteroscopy  02-10-2005/   02-17-2005/   03-18-2001    02-17-2005-- STENT PLACEMENT  . Dilation and curettage of uterus  04-09-2004    W/  SUCTION  . Cysto/  bilateral ureteroscopic laser lithotripsy/  bilateral ureteral stone extraction/  bilateral stent placement  01-22-2008  . Cystoscopy with retrograde pyelogram, ureteroscopy and stent  placement Left 09/25/2013    Procedure: LEFT RETROGRADE PYELOGRAM, URETEROSCOPY LASER LITHO AND STENT PLACEMENT;  Surgeon: Garnett FarmMark C Ottelin, MD;  [facility] [place];  Service: Urology;  Laterality: Left;  . Holmium laser application Left 09/25/2013    Procedure: HOLMIUM LASER APPLICATION;  Surgeon: Garnett FarmMark C Ottelin, MD;  [facility] [place];  Service: Urology;  Laterality: Left;  . Nephrolithotomy Right 12/15/2013    Procedure: RIGHT PERCUTANEOUS NEPHROLITHOTOMY ;  Surgeon: Garnett FarmMark C Ottelin, MD;  Location: [facility];  Service: Urology;  Laterality: Right;  . Holmium laser application N/A 12/15/2013    Procedure: HOLMIUM LASER APPLICATION;  Surgeon: Garnett FarmMark C Ottelin, MD;  Location: [facility];  Service: Urology;  Laterality: N/A;    Social History   Social History  . Marital Status: Married    Spouse Name: N/A  . Number of Children: N/A  . Years of Education: N/A   Occupational History  . Not on file.   Social History Main Topics  . Smoking status: Never Smoker   . Smokeless tobacco: Never Used  . Alcohol Use: Yes     Comment: rarely  . Drug Use: No  . Sexual Activity: Not on file   Other Topics Concern  . Not on file   Social History Narrative    ROS: no fevers or chills, productive cough, hemoptysis, dysphasia, odynophagia, melena, hematochezia, dysuria, hematuria, rash, seizure activity, orthopnea, PND, pedal edema,  claudication. Remaining systems are negative.  Physical Exam: Well-developed well-nourished in no acute distress.  Skin is warm and dry.  HEENT is normal.  Neck is supple.  Chest is clear to auscultation with normal expansion.  Cardiovascular exam is regular rate and rhythm.  Abdominal exam nontender or distended. No masses palpated. Extremities show no edema. Congenital absence of RUE neuro grossly intact  ECG NSR, Nonspecific ST changes. No change compared to July 2015.

## 2015-03-12 ENCOUNTER — Ambulatory Visit (INDEPENDENT_AMBULATORY_CARE_PROVIDER_SITE_OTHER): Payer: BLUE CROSS/BLUE SHIELD | Admitting: Cardiology

## 2015-03-12 ENCOUNTER — Encounter: Payer: Self-pay | Admitting: Cardiology

## 2015-03-12 VITALS — BP 90/60 | HR 61 | Ht 64.0 in | Wt 146.5 lb

## 2015-03-12 DIAGNOSIS — I10 Essential (primary) hypertension: Secondary | ICD-10-CM

## 2015-03-12 MED ORDER — METOPROLOL SUCCINATE ER 50 MG PO TB24: 50.0000 mg | ORAL_TABLET | Freq: Every day | ORAL | Status: DC

## 2015-03-12 NOTE — Assessment & Plan Note (Signed)
Blood pressure controlled. Continue present medications. 

## 2015-03-12 NOTE — Assessment & Plan Note (Signed)
Patient symptoms are controlled. Continue Toprol. Would consider referral to EP for ablation if symptoms worsen in the future. No recent episodes.

## 2015-03-12 NOTE — Patient Instructions (Signed)
Your physician wants you to follow-up in: ONE YEAR WITH DR CRENSHAW You will receive a reminder letter in the mail two months in advance. If you don't receive a letter, please call our office to schedule the follow-up appointment.  

## 2015-04-19 ENCOUNTER — Other Ambulatory Visit: Payer: Self-pay | Admitting: Urology

## 2015-05-17 ENCOUNTER — Encounter (HOSPITAL_BASED_OUTPATIENT_CLINIC_OR_DEPARTMENT_OTHER): Payer: Self-pay | Admitting: *Deleted

## 2015-05-17 NOTE — Progress Notes (Signed)
NPO AFTER MN.  ARRIVE AT [id number].  NEEDS ISTAT AND KUB. CURRENT EKG IN CHART AND EPIC.

## 2015-05-20 NOTE — Anesthesia Preprocedure Evaluation (Addendum)
Anesthesia Evaluation  Patient identified by MRN, date of birth, ID band Patient awake    Reviewed: Allergy & Precautions, H&P , Patient's Chart, lab work & pertinent test results, reviewed documented beta blocker date and time   History of Anesthesia Complications (+) PONV and history of anesthetic complications  Airway Mallampati: II  TM Distance: >3 FB Neck ROM: full    Dental no notable dental hx.    Pulmonary    Pulmonary exam normal breath sounds clear to auscultation       Cardiovascular hypertension, + dysrhythmias Supra Ventricular Tachycardia  Rhythm:regular Rate:Normal     Neuro/Psych    GI/Hepatic   Endo/Other    Renal/GU      Musculoskeletal   Abdominal   Peds  Hematology   Anesthesia Other Findings PONV   ZOFRAN IN SURGERY HELPS  Hypertension      PSVT .......Marland Kitchen. DR Jens SomRENSHAW  [facility] . No problems sinc  ( old chart shows " awareness under anesthesia..... Errant keystroke....... No Hx of awareness per Patient)           Reproductive/Obstetrics                          Anesthesia Physical Anesthesia Plan  ASA: II  Anesthesia Plan:    Post-op Pain Management:    Induction: Intravenous  Airway Management Planned: LMA  Additional Equipment:   Intra-op Plan:   Post-operative Plan:   Informed Consent: I have reviewed the patients History and Physical, chart, labs and discussed the procedure including the risks, benefits and alternatives for the proposed anesthesia with the patient or authorized representative who has indicated his/her understanding and acceptance.   Dental Advisory Given and Dental advisory given  Plan Discussed with: CRNA and Surgeon  Anesthesia Plan Comments: (Discussed GA with LMA, possible sore throat, potential need to switch to ETT, N/V, pulmonary aspiration. Questions answered. )        Anesthesia Quick Evaluation

## 2015-05-21 ENCOUNTER — Ambulatory Visit (HOSPITAL_BASED_OUTPATIENT_CLINIC_OR_DEPARTMENT_OTHER): Payer: BLUE CROSS/BLUE SHIELD | Admitting: Anesthesiology

## 2015-05-21 ENCOUNTER — Ambulatory Visit (HOSPITAL_COMMUNITY): Payer: BLUE CROSS/BLUE SHIELD

## 2015-05-21 ENCOUNTER — Encounter (HOSPITAL_BASED_OUTPATIENT_CLINIC_OR_DEPARTMENT_OTHER)
Admission: RE | Disposition: A | Discharge status: Home / Self Care | Payer: Self-pay | Source: Ambulatory Visit | Attending: Urology

## 2015-05-21 ENCOUNTER — Ambulatory Visit (HOSPITAL_BASED_OUTPATIENT_CLINIC_OR_DEPARTMENT_OTHER)
Admission: RE | Admit: 2015-05-21 | Discharge: 2015-05-21 | Disposition: A | Discharge status: Home / Self Care | Payer: BLUE CROSS/BLUE SHIELD | Source: Ambulatory Visit | Attending: Urology | Admitting: Urology

## 2015-05-21 ENCOUNTER — Encounter (HOSPITAL_BASED_OUTPATIENT_CLINIC_OR_DEPARTMENT_OTHER): Payer: Self-pay | Admitting: *Deleted

## 2015-05-21 DIAGNOSIS — Z87442 Personal history of urinary calculi: Secondary | ICD-10-CM | POA: Insufficient documentation

## 2015-05-21 DIAGNOSIS — Z841 Family history of disorders of kidney and ureter: Secondary | ICD-10-CM | POA: Insufficient documentation

## 2015-05-21 DIAGNOSIS — I10 Essential (primary) hypertension: Secondary | ICD-10-CM | POA: Diagnosis not present

## 2015-05-21 DIAGNOSIS — I472 Ventricular tachycardia: Secondary | ICD-10-CM | POA: Insufficient documentation

## 2015-05-21 DIAGNOSIS — I471 Supraventricular tachycardia: Secondary | ICD-10-CM | POA: Insufficient documentation

## 2015-05-21 DIAGNOSIS — E7201 Cystinuria: Secondary | ICD-10-CM | POA: Insufficient documentation

## 2015-05-21 DIAGNOSIS — N200 Calculus of kidney: Secondary | ICD-10-CM | POA: Diagnosis present

## 2015-05-21 HISTORY — DX: Calculus of kidney: N20.0

## 2015-05-21 HISTORY — PX: CYSTOSCOPY W/ RETROGRADES: SHX1426

## 2015-05-21 HISTORY — PX: CYSTOSCOPY/URETEROSCOPY/HOLMIUM LASER/STENT PLACEMENT: SHX6546

## 2015-05-21 LAB — POCT I-STAT, CHEM 8
BUN: 16 mg/dL (ref 6–20)
Calcium, Ion: 1.18 mmol/L (ref 1.12–1.23)
Chloride: 102 mmol/L (ref 101–111)
Creatinine, Ser: 0.7 mg/dL (ref 0.44–1.00)
Glucose, Bld: 92 mg/dL (ref 65–99)
HCT: 50 % — ABNORMAL HIGH (ref 36.0–46.0)
Hemoglobin: 17 g/dL — ABNORMAL HIGH (ref 12.0–15.0)
Potassium: 4.3 mmol/L (ref 3.5–5.1)
Sodium: 141 mmol/L (ref 135–145)
TCO2: 24 mmol/L (ref 0–100)

## 2015-05-21 SURGERY — CYSTOSCOPY/URETEROSCOPY/HOLMIUM LASER/STENT PLACEMENT
Anesthesia: General | Laterality: Right

## 2015-05-21 MED ORDER — PHENAZOPYRIDINE HCL 200 MG PO TABS: 200.0000 mg | ORAL_TABLET | Freq: Three times a day (TID) | ORAL | Status: DC | PRN

## 2015-05-21 MED ORDER — CIPROFLOXACIN IN D5W 200 MG/100ML IV SOLN
INTRAVENOUS | Status: AC
  Filled 2015-05-21: qty 100

## 2015-05-21 MED ORDER — EPHEDRINE SULFATE 50 MG/ML IJ SOLN
INTRAMUSCULAR | Status: AC
  Filled 2015-05-21: qty 1

## 2015-05-21 MED ORDER — ONDANSETRON HCL 4 MG/2ML IJ SOLN
INTRAMUSCULAR | Status: AC
  Filled 2015-05-21: qty 2

## 2015-05-21 MED ORDER — ACETAMINOPHEN 160 MG/5ML PO SOLN
ORAL | Status: AC
  Filled 2015-05-21: qty 40.6

## 2015-05-21 MED ORDER — SCOPOLAMINE 1 MG/3DAYS TD PT72
MEDICATED_PATCH | TRANSDERMAL | Status: AC
  Filled 2015-05-21: qty 1

## 2015-05-21 MED ORDER — PROMETHAZINE HCL 25 MG/ML IJ SOLN
INTRAMUSCULAR | Status: AC
  Filled 2015-05-21: qty 1

## 2015-05-21 MED ORDER — CIPROFLOXACIN IN D5W 200 MG/100ML IV SOLN
200.0000 mg | INTRAVENOUS | Status: AC
  Administered 2015-05-21: 400 mg via INTRAVENOUS
  Filled 2015-05-21: qty 100

## 2015-05-21 MED ORDER — LIDOCAINE HCL (CARDIAC) 20 MG/ML IV SOLN
INTRAVENOUS | Status: AC
  Filled 2015-05-21: qty 5

## 2015-05-21 MED ORDER — FENTANYL CITRATE (PF) 100 MCG/2ML IJ SOLN
25.0000 ug | INTRAMUSCULAR | Status: DC | PRN
  Administered 2015-05-21: 50 ug via INTRAVENOUS
  Filled 2015-05-21: qty 1

## 2015-05-21 MED ORDER — GLYCOPYRROLATE 0.2 MG/ML IJ SOLN
INTRAMUSCULAR | Status: AC
  Filled 2015-05-21: qty 1

## 2015-05-21 MED ORDER — PROMETHAZINE HCL 25 MG/ML IJ SOLN
6.2500 mg | Freq: Once | INTRAMUSCULAR | Status: AC
  Administered 2015-05-21: 6.25 mg via INTRAVENOUS
  Filled 2015-05-21: qty 1

## 2015-05-21 MED ORDER — MIRABEGRON ER 50 MG PO TB24: 50.0000 mg | ORAL_TABLET | Freq: Every day | ORAL | Status: DC | PRN

## 2015-05-21 MED ORDER — EPHEDRINE SULFATE 50 MG/ML IJ SOLN
INTRAMUSCULAR | Status: DC | PRN
  Administered 2015-05-21: 10 mg via INTRAVENOUS

## 2015-05-21 MED ORDER — WHITE PETROLATUM GEL
Status: AC
  Filled 2015-05-21: qty 5

## 2015-05-21 MED ORDER — LACTATED RINGERS IV SOLN
INTRAVENOUS | Status: DC
  Administered 2015-05-21 (×2): via INTRAVENOUS
  Filled 2015-05-21: qty 1000

## 2015-05-21 MED ORDER — FENTANYL CITRATE (PF) 100 MCG/2ML IJ SOLN
INTRAMUSCULAR | Status: AC
  Filled 2015-05-21: qty 2

## 2015-05-21 MED ORDER — MIDAZOLAM HCL 2 MG/2ML IJ SOLN
INTRAMUSCULAR | Status: AC
  Filled 2015-05-21: qty 2

## 2015-05-21 MED ORDER — ONDANSETRON HCL 4 MG/2ML IJ SOLN
4.0000 mg | Freq: Once | INTRAMUSCULAR | Status: AC
  Administered 2015-05-21: 4 mg via INTRAVENOUS
  Filled 2015-05-21: qty 2

## 2015-05-21 MED ORDER — SCOPOLAMINE 1 MG/3DAYS TD PT72
1.0000 | MEDICATED_PATCH | TRANSDERMAL | Status: DC
  Administered 2015-05-21: 1.5 mg via TRANSDERMAL
  Filled 2015-05-21: qty 1

## 2015-05-21 MED ORDER — PROPOFOL 10 MG/ML IV BOLUS
INTRAVENOUS | Status: AC
  Filled 2015-05-21: qty 20

## 2015-05-21 MED ORDER — OXYCODONE HCL 10 MG PO TABS: 10.0000 mg | ORAL_TABLET | ORAL | Status: DC | PRN

## 2015-05-21 MED ORDER — MIDAZOLAM HCL 5 MG/5ML IJ SOLN
INTRAMUSCULAR | Status: DC | PRN
  Administered 2015-05-21: 2 mg via INTRAVENOUS

## 2015-05-21 MED ORDER — LIDOCAINE HCL (CARDIAC) 20 MG/ML IV SOLN
INTRAVENOUS | Status: DC | PRN
  Administered 2015-05-21: 80 mg via INTRAVENOUS

## 2015-05-21 MED ORDER — SUCCINYLCHOLINE CHLORIDE 20 MG/ML IJ SOLN
INTRAMUSCULAR | Status: DC | PRN
Start: 2015-05-21 — End: 2015-05-21
  Administered 2015-05-21: 100 mg via INTRAVENOUS

## 2015-05-21 MED ORDER — ACETAMINOPHEN 10 MG/ML IV SOLN
1000.0000 mg | Freq: Four times a day (QID) | INTRAVENOUS | Status: DC
  Filled 2015-05-21: qty 100

## 2015-05-21 MED ORDER — SODIUM CHLORIDE 0.9 % IR SOLN
Status: DC | PRN
  Administered 2015-05-21: 3000 mL
  Administered 2015-05-21: 1000 mL
  Administered 2015-05-21: 3000 mL
  Administered 2015-05-21: 1000 mL

## 2015-05-21 MED ORDER — NEOSTIGMINE METHYLSULFATE 10 MG/10ML IV SOLN
INTRAVENOUS | Status: AC
  Filled 2015-05-21: qty 1

## 2015-05-21 MED ORDER — PROPOFOL 10 MG/ML IV BOLUS
INTRAVENOUS | Status: DC | PRN
  Administered 2015-05-21: 170 mg via INTRAVENOUS

## 2015-05-21 MED ORDER — FENTANYL CITRATE (PF) 100 MCG/2ML IJ SOLN
INTRAMUSCULAR | Status: DC | PRN
  Administered 2015-05-21 (×2): 25 ug via INTRAVENOUS
  Administered 2015-05-21: 50 ug via INTRAVENOUS
  Administered 2015-05-21: 25 ug via INTRAVENOUS

## 2015-05-21 MED ORDER — ROCURONIUM BROMIDE 100 MG/10ML IV SOLN
INTRAVENOUS | Status: DC | PRN
  Administered 2015-05-21: 5 mg via INTRAVENOUS
  Administered 2015-05-21: 15 mg via INTRAVENOUS
  Administered 2015-05-21: 5 mg via INTRAVENOUS

## 2015-05-21 MED ORDER — ONDANSETRON HCL 4 MG/2ML IJ SOLN
INTRAMUSCULAR | Status: DC | PRN
  Administered 2015-05-21: 4 mg via INTRAVENOUS

## 2015-05-21 MED ORDER — DEXAMETHASONE SODIUM PHOSPHATE 4 MG/ML IJ SOLN
INTRAMUSCULAR | Status: DC | PRN
  Administered 2015-05-21: 10 mg via INTRAVENOUS

## 2015-05-21 MED ORDER — GLYCOPYRROLATE 0.2 MG/ML IJ SOLN
INTRAMUSCULAR | Status: AC
  Filled 2015-05-21: qty 2

## 2015-05-21 MED ORDER — DEXAMETHASONE SODIUM PHOSPHATE 10 MG/ML IJ SOLN
INTRAMUSCULAR | Status: AC
  Filled 2015-05-21: qty 1

## 2015-05-21 MED ORDER — ACETAMINOPHEN 160 MG/5ML PO SOLN
975.0000 mg | Freq: Once | ORAL | Status: AC
  Administered 2015-05-21: 975 mg via ORAL
  Filled 2015-05-21: qty 30.5

## 2015-05-21 MED ORDER — IOHEXOL 350 MG/ML SOLN
INTRAVENOUS | Status: DC | PRN
  Administered 2015-05-21: 4 mL

## 2015-05-21 MED ORDER — GLYCOPYRROLATE 0.2 MG/ML IJ SOLN
INTRAMUSCULAR | Status: DC | PRN
  Administered 2015-05-21: 0.2 mg via INTRAVENOUS
  Administered 2015-05-21: 0.4 mg via INTRAVENOUS

## 2015-05-21 MED ORDER — NEOSTIGMINE METHYLSULFATE 10 MG/10ML IV SOLN
INTRAVENOUS | Status: DC | PRN
  Administered 2015-05-21: 3 mg via INTRAVENOUS

## 2015-05-21 MED ORDER — ROCURONIUM BROMIDE 100 MG/10ML IV SOLN
INTRAVENOUS | Status: AC
  Filled 2015-05-21: qty 1

## 2015-05-21 MED FILL — PHENAZOPYRIDINE 200 MG TAB: 200 | 10 days supply | Qty: 30 | Fill #0

## 2015-05-21 MED FILL — oxyCODONE HCL 10 MG TABS: 10 | 30 days supply | Qty: 30 | Fill #0

## 2015-05-21 MED FILL — MYRBETRIQ ER 50 MG TABLET: 50 | 20 days supply | Qty: 20 | Fill #0

## 2015-05-21 SURGICAL SUPPLY — 43 items
ADAPTER CATH URET PLST 4-6FR (CATHETERS) IMPLANT
ADPR CATH URET STRL DISP 4-6FR (CATHETERS)
BAG DRAIN URO-CYSTO SKYTR STRL (DRAIN) ×2 IMPLANT
BAG DRN UROCATH (DRAIN) ×1
BASKET DAKOTA 1.9FR 11X120 (BASKET) ×1 IMPLANT
BASKET LASER NITINOL 1.9FR (BASKET) IMPLANT
BASKET STNLS GEMINI 4WIRE 3FR (BASKET) IMPLANT
BASKET ZERO TIP NITINOL 2.4FR (BASKET) ×1 IMPLANT
BSKT STON RTRVL 120 1.9FR (BASKET)
BSKT STON RTRVL GEM 120X11 3FR (BASKET)
BSKT STON RTRVL ZERO TP 2.4FR (BASKET) ×1
CANISTER SUCT LVC 12 LTR MEDI- (MISCELLANEOUS) IMPLANT
CATH INTERMIT  6FR 70CM (CATHETERS) IMPLANT
CATH URET 5FR 28IN CONE TIP (BALLOONS)
CATH URET 5FR 70CM CONE TIP (BALLOONS) IMPLANT
CLOTH BEACON ORANGE TIMEOUT ST (SAFETY) ×2 IMPLANT
ELECT REM PT RETURN 9FT ADLT (ELECTROSURGICAL)
ELECTRODE REM PT RTRN 9FT ADLT (ELECTROSURGICAL) IMPLANT
FIBER LASER FLEXIVA 365 (UROLOGICAL SUPPLIES) IMPLANT
FIBER LASER FLEXIVA 550 (UROLOGICAL SUPPLIES) IMPLANT
FIBER LASER TRAC TIP (UROLOGICAL SUPPLIES) ×1 IMPLANT
GLOVE BIO SURGEON STRL SZ8 (GLOVE) ×2 IMPLANT
GOWN STRL REUS W/ TWL LRG LVL3 (GOWN DISPOSABLE) ×1 IMPLANT
GOWN STRL REUS W/ TWL XL LVL3 (GOWN DISPOSABLE) ×1 IMPLANT
GOWN STRL REUS W/TWL LRG LVL3 (GOWN DISPOSABLE) ×2
GOWN STRL REUS W/TWL XL LVL3 (GOWN DISPOSABLE) ×2
GUIDEWIRE 0.038 PTFE COATED (WIRE) IMPLANT
GUIDEWIRE ANG ZIPWIRE 038X150 (WIRE) IMPLANT
GUIDEWIRE STR DUAL SENSOR (WIRE) ×2 IMPLANT
IV NS 1000ML (IV SOLUTION) ×2
IV NS 1000ML BAXH (IV SOLUTION) IMPLANT
IV NS IRRIG 3000ML ARTHROMATIC (IV SOLUTION) ×5 IMPLANT
KIT BALLIN UROMAX 15FX10 (LABEL) IMPLANT
KIT BALLN UROMAX 15FX4 (MISCELLANEOUS) IMPLANT
KIT BALLN UROMAX 26 75X4 (MISCELLANEOUS)
KIT ROOM TURNOVER WOR (KITS) ×2 IMPLANT
MANIFOLD NEPTUNE II (INSTRUMENTS) IMPLANT
PACK CYSTO (CUSTOM PROCEDURE TRAY) ×2 IMPLANT
SET HIGH PRES BAL DIL (LABEL)
STENT URET 6FRX24 CONTOUR (STENTS) ×1 IMPLANT
TUBE CONNECTING 12X1/4 (SUCTIONS) IMPLANT
TUBING TUR DISP (UROLOGICAL SUPPLIES) ×1 IMPLANT
WATER STERILE IRR 3000ML UROMA (IV SOLUTION) IMPLANT

## 2015-05-21 NOTE — H&P (Signed)
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 She was on Thiola in the past and tolerated this. She's had multiple surgical procedures for her stones dating back to 2/95 when she underwent bilateral lithotripsy.   In 1/04 she had a left percutaneous nephrostolithotomy.   10/06 right ureteroscopy with the finding of multiple Randles plaques but no stone that could be extracted. She ended up having to have a stent placed later that same month for ureteral obstruction.   12/07 she underwent another left percutaneous nephrostolithotomy.   9/15 right PCNL for 2.5 cm stone.  Previous pharmacologic treatments: She tolerated Thiola in the distant past. She was placed on captopril for sometime but this was not effective. Thiola therapy was reinitiated but caused palpitations and had to be discontinued.  Current therapy: KCit . b.i.d.    Interval history: She has been doing well. She passed one stone seen sensing her last and she described it as a fairly large stone. She is currently on potassium citrate only. No urologic complaints are noted today.   Past Medical History Problems  1. History of hypertension (Z86.79) 2. History of Murmur (R01.1)  Surgical History Problems  1. History of Cystoscopy With Insertion Of Ureteral Stent Bilateral 2. History of Cystoscopy With Insertion Of Ureteral Stent Left 3. History of Cystoscopy With Insertion Of Ureteral Stent Right 4. History of Cystoscopy With Pyeloscopy With Lithotripsy 5. History of Cystoscopy With Pyeloscopy With Removal Of Calculus 6. History of Cystoscopy With Ureteroscopy Right 7. History of Cystoscopy With Ureteroscopy With Lithotripsy 8. History of Percutaneous Lithotomy 9. History of Percutaneous Lithotomy 10. History of Percutaneous Lithotomy For Stone Over 2cm. 11. History of Renal Lithotripsy  Current Meds 1.  Metoprolol Succinate ER 25 MG Oral Tablet Extended Release 24 Hour;  Therapy: 12Dec2014 to Recorded 2. Potassium Citrate ER 15 MEQ (1620 MG) Oral Tablet Extended Release; TAKE 2 TABLET  Twice daily;  Therapy: 22Feb2016 to (Evaluate:16Feb2017)  Requested for: 22Feb2016; Last  Rx:22Feb2016 Ordered  Allergies Medication  1. Bactrim TABS 2. Demerol TABS 3. Morphine Derivatives  Family History Problems  1. Family history of Death In The Family Father : Father   age 75; car accident 2. Family history of Family Health Status Number Of Children   1 son 3. Family history of Urologic Disorder  Social History Problems  1. Denied: Alcohol Use (History) 2. Caffeine Use 3. Family history of Death In The Family Father 4. Marital History - Currently Married 5. Never a smoker 6. Occupation:   Estate agent 7. Denied: Tobacco Use    Vitals Vital Signs  Blood Pressure: 105 / 66 Temperature: 97.3 F Heart Rate: 57  Review of Systems Genitourinary, constitutional, skin, eye, otolaryngeal, hematologic/lymphatic, cardiovascular, pulmonary, endocrine, musculoskeletal, gastrointestinal, neurological and psychiatric system(s) were reviewed and pertinent findings if present are noted.  Genitourinary: dysuria, hematuria and cloudy urine.  Gastrointestinal: nausea.    Physical Exam Constitutional: Well nourished and well developed . No acute distress.   ENT:. The ears and nose are normal in appearance.   Neck: The appearance of the neck is normal and no neck mass is present.   Pulmonary: No respiratory distress and normal respiratory rhythm and effort.   Cardiovascular:. No obvious murmurs are appreciated.   Abdomen: No suprapubic tenderness. No right CVA tenderness and left CVA tenderness.   Extremities: No clubbing, cyanosis or edema.  She has a prosthetic right  lower arm.  Lymphatics: The posterior cervical, anterior cervical and supraclavicular nodes are not enlarged or tender.    Skin: Normal skin turgor, no visible rash and no visible skin lesions.   Neuro/Psych:. Mood and affect are appropriate.   Result COLOR STRAW  APPEARANCE CLEAR  SPECIFIC GRAVITY 1.015  pH 8.0  GLUCOSE NEGATIVE  BILIRUBIN NEGATIVE  KETONE NEGATIVE  BLOOD 2+  PROTEIN NEGATIVE  NITRITE NEGATIVE  LEUKOCYTE ESTERASE NEGATIVE  SQUAMOUS EPITHELIAL/HPF 6-10 HPF WBC 0-5 WBC/HPF RBC 10-20 RBC/HPF BACTERIA FEW HPF CRYSTALS NONE SEEN HPF CASTS NONE SEEN LPF Yeast NONE SEEN HPF  Renal ultrasound: The right kidney measured 11.7 cm with 12 mm of parenchyma in the left kidney measured 10.3 cm with 11 mm of parenchyma. Both kidneys have normal cortical renal sinus echo patterns without evidence of hydronephrosis or mass. A simple cyst was noted in the upper pole of the right kidney. The largest on the right kidney measured 1.5 cm. There were several other stones in the mid to lower pole with what appeared to be 2 stones in the left kidney the largest measuring approximately 5 mm. No dilatation of the ureters were noted in the bladder was unremarkable and empty.  The following clinical lab reports were reviewed:  UA: PH of 8.0 with some red cells but it was otherwise negative.    Assessment   She used to develop stones primarily on the left-hand side but it appears the stones in her left kidney are relatively stable with only 2 stones noted there this time. On the right-hand side some of the smaller stones have remained stable but the stone in the upper pole has increased from 4 mm up to 1.14 mm. This is despite the fact that her urine remains alkalinized. We discussed proceeding with treatment before the stone increased in size further and due to its current size I believe it can be managed ureteroscopically rather than having to perform a percutaneous procedure. She would like to proceed in that fashion.   Plan   1. Continue potassium citrate.  2. She'll be scheduled for right ureteroscopy and  laser lithotripsy of all of her right renal calculi.

## 2015-05-21 NOTE — Transfer of Care (Signed)
Immediate Anesthesia Transfer of Care Note  Patient: [name]  Procedure(s) Performed: Procedure(s) (LRB): RIGHT URETEROSCOPY/HOLMIUM LASER LITHOTRIPSY/RIGHT STENT PLACEMENT (Right) CYSTOSCOPY WITH RETROGRADE PYELOGRAM (Right)  Patient Location: PACU  Anesthesia Type: General  Level of Consciousness: awake, oriented, sedated and patient cooperative  Airway & Oxygen Therapy: Patient Spontanous Breathing and Patient connected to face mask oxygen  Post-op Assessment: Report given to PACU RN and Post -op Vital signs reviewed and stable  Post vital signs: Reviewed and stable  Complications: No apparent anesthesia complications

## 2015-05-21 NOTE — Op Note (Signed)
PATIENT:  [name] [name]  PRE-OPERATIVE DIAGNOSIS:  Right renal calculi  POST-OPERATIVE DIAGNOSIS: Same  PROCEDURE:  1. Cystoscopy with right retrograde pyelogram including interpretation 2. Right ureteroscopy and laser lithotripsy 3. Right ureteroscopic stone extraction 4. Right double-J stent placement  SURGEON: Garnett FarmMark C Mykale Gandolfo, MD  INDICATION: [name] [name] is a 50 year old female with cystinuria. She has redeveloped renal calculi we discussed the treatment options and elected to proceed with ureteroscopic management.  ANESTHESIA:  General  EBL:  50 mL  DRAINS: 6 French, 24 cm double-J stent in the right ureter (no string)  SPECIMEN:  None  DESCRIPTION OF PROCEDURE: The patient was taken to the major OR and placed on the table. General anesthesia was administered and then the patient was moved to the dorsal lithotomy position. The genitalia was sterilely prepped and draped. An official timeout was performed.  Initially the 23 French cystoscope with 30 lens was passed under direct vision into the bladder. The bladder was then fully inspected. It was noted be free of any tumors, stones or inflammatory lesions. Ureteral orifices were of normal configuration and position. A 6 French open-ended ureteral catheter was then passed through the cystoscope into the ureteral orifice in order to perform a right retrograde pyelogram.  A retrograde pyelogram was performed by injecting full-strength contrast up the right ureter under direct fluoroscopic control. It revealed a filling defect in the upper pole of the right kidney consistent with the stone seen on the previous CT and preop KUB. The remainder of the ureter was noted to be normal as was the intrarenal collecting system. I then passed a 0.038 inch floppy-tipped guidewire through the open ended catheter and into the area of the renal pelvis and this was left in place. A ureteral access sheath was then passed over the guidewire into the area of the  UPJ under fluoroscopy and the inner portion of the access sheath as well as guidewire were removed. I then proceeded with ureteroscopy.  A 6 [place] digital, flexible ureteroscope was then passed through the access sheath into the area of the renal pelvis. I began a systematic inspection of each calyx and identified the large stone in an upper pole calyx. The remainder of the calyces appeared to be normal with no stones except a single stone in the mid to lower pole calyx as well. The 200  holmium laser fiber was used to fragment the stone in the lower pole calyx first. I then used the nitinol basket to extract all of the stone fragments.I then turned my attention to the stone in the upper pole. It was very large and because it was a cystine stone it was very hard. I used various combinations of power and frequency of the holmium laser in order to fragment the stone. After about half of it was broken into smaller pieces I used the [place] basket to remove the stone fragments. I then began to fragment the remainder of the stone and was able to fragment it completely into pieces that I felt were of a size that could be extracted. I began extracting these pieces but encountered bleeding that limited my visualization. I felt I was able to remove a majority of the stone fragments although there is no doubt there were some remaining and the question is whether they are of a size that are passable as she has passed many stones previously. Because of poor visualization due to bleeding, despite the fact that I had attached to sources of water to the  dual lumen ureteroscope I was not able to visualize the remaining stone fragments.   I therefore passed the 0.038 inch floppy-tipped guidewire through the ureteroscope into the area the renal pelvis and left this in place as I removed the ureteroscope and the access sheath. I then backloaded the cystoscope over the guidewire and passed the stent over the guidewire into the  area of the renal pelvis. As the guidewire was removed good curl was noted in the renal pelvis. The bladder was drained and the cystoscope was then removed. The patient tolerated the procedure well no intraoperative complications.  PLAN OF CARE: Discharge to home after PACU  PATIENT DISPOSITION:  PACU - hemodynamically stable.

## 2015-05-21 NOTE — Anesthesia Procedure Notes (Addendum)
Procedure Name: LMA Insertion Date/Time: 05/21/2015 9:31 AM Performed by: Renella CunasHAZEL, Charmika Macdonnell D Pre-anesthesia Checklist: Patient identified, Emergency Drugs available, Suction available and Patient being monitored Patient Re-evaluated:Patient Re-evaluated prior to inductionOxygen Delivery Method: Circle System Utilized Preoxygenation: Pre-oxygenation with 100% oxygen Intubation Type: IV induction Ventilation: Mask ventilation without difficulty LMA: LMA inserted LMA Size: 4.0 Number of attempts: 1 Airway Equipment and Method: Bite block Placement Confirmation: positive ETCO2 Tube secured with: Tape Dental Injury: Teeth and Oropharynx as per pre-operative assessment    Procedure Name: Intubation Date/Time: 05/21/2015 10:00 AM Performed by: Renella CunasHAZEL, Tighe Gitto D Pre-anesthesia Checklist: Patient identified, Emergency Drugs available, Suction available and Patient being monitored Patient Re-evaluated:Patient Re-evaluated prior to inductionOxygen Delivery Method: Circle System Utilized Preoxygenation: Pre-oxygenation with 100% oxygen Intubation Type: IV induction Ventilation: Mask ventilation without difficulty Laryngoscope Size: Mac and 3 Grade View: Grade I Tube type: Oral Tube size: 7.0 mm Number of attempts: 1 Airway Equipment and Method: Stylet and Oral airway Placement Confirmation: ETT inserted through vocal cords under direct vision,  positive ETCO2 and breath sounds checked- equal and bilateral Secured at: 20 cm Tube secured with: Tape Dental Injury: Teeth and Oropharynx as per pre-operative assessment

## 2015-05-21 NOTE — Discharge Instructions (Signed)

## 2015-05-21 NOTE — Anesthesia Postprocedure Evaluation (Signed)
Anesthesia Post Note  Patient: [name] [name]  Procedure(s) Performed: Procedure(s) (LRB): RIGHT URETEROSCOPY/HOLMIUM LASER LITHOTRIPSY/RIGHT STENT PLACEMENT (Right) CYSTOSCOPY WITH RETROGRADE PYELOGRAM (Right)  Patient location during evaluation: PACU Anesthesia Type: General Level of consciousness: awake and alert Pain management: pain level controlled Vital Signs Assessment: post-procedure vital signs reviewed and stable Respiratory status: spontaneous breathing, nonlabored ventilation, respiratory function stable and patient connected to nasal cannula oxygen Cardiovascular status: blood pressure returned to baseline and stable Postop Assessment: no signs of nausea or vomiting Anesthetic complications: no    Last Vitals:  Filed Vitals:   05/21/15 1245 05/21/15 1300  BP: 122/74 115/70  Pulse: 53 50  Temp:    Resp: 14 15    Last Pain:  Filed Vitals:   05/21/15 1336  PainSc: 0-No pain                 Reino KentJudd, Etherine Mackowiak J

## 2015-05-24 ENCOUNTER — Encounter (HOSPITAL_BASED_OUTPATIENT_CLINIC_OR_DEPARTMENT_OTHER): Payer: Self-pay | Admitting: Urology

## 2015-05-28 ENCOUNTER — Other Ambulatory Visit: Payer: Self-pay | Admitting: Urology

## 2015-05-28 ENCOUNTER — Encounter (HOSPITAL_BASED_OUTPATIENT_CLINIC_OR_DEPARTMENT_OTHER): Payer: Self-pay | Admitting: *Deleted

## 2015-05-28 NOTE — Progress Notes (Signed)
NPO AFTER MN.  ARRIVE AT 0715.  CURRENT LAB RESULTS AND EKG IN CHART AND EPIC.

## 2015-05-31 ENCOUNTER — Ambulatory Visit (HOSPITAL_BASED_OUTPATIENT_CLINIC_OR_DEPARTMENT_OTHER): Admission: RE | Admit: 2015-05-31 | Payer: BLUE CROSS/BLUE SHIELD | Source: Ambulatory Visit | Admitting: Urology

## 2015-05-31 SURGERY — CYSTOSCOPY/URETEROSCOPY/HOLMIUM LASER/STENT PLACEMENT
Anesthesia: General | Laterality: Right

## 2015-06-14 ENCOUNTER — Other Ambulatory Visit: Payer: Self-pay | Admitting: Urology

## 2015-07-09 ENCOUNTER — Encounter (HOSPITAL_BASED_OUTPATIENT_CLINIC_OR_DEPARTMENT_OTHER): Payer: Self-pay | Admitting: *Deleted

## 2015-07-09 NOTE — Progress Notes (Signed)
NPO AFTER MN. ARRIVE AT 0600. NEEDS ISTAT. CURRENT EKG IN CHART AND EPIC.  

## 2015-07-16 ENCOUNTER — Ambulatory Visit (HOSPITAL_BASED_OUTPATIENT_CLINIC_OR_DEPARTMENT_OTHER): Payer: BLUE CROSS/BLUE SHIELD | Admitting: Anesthesiology

## 2015-07-16 ENCOUNTER — Ambulatory Visit (HOSPITAL_BASED_OUTPATIENT_CLINIC_OR_DEPARTMENT_OTHER)
Admission: RE | Admit: 2015-07-16 | Discharge: 2015-07-16 | Disposition: A | Discharge status: Home / Self Care | Payer: BLUE CROSS/BLUE SHIELD | Source: Ambulatory Visit | Attending: Urology | Admitting: Urology

## 2015-07-16 ENCOUNTER — Encounter (HOSPITAL_BASED_OUTPATIENT_CLINIC_OR_DEPARTMENT_OTHER): Payer: Self-pay | Admitting: *Deleted

## 2015-07-16 ENCOUNTER — Encounter (HOSPITAL_BASED_OUTPATIENT_CLINIC_OR_DEPARTMENT_OTHER)
Admission: RE | Disposition: A | Discharge status: Home / Self Care | Payer: Self-pay | Source: Ambulatory Visit | Attending: Urology

## 2015-07-16 DIAGNOSIS — I10 Essential (primary) hypertension: Secondary | ICD-10-CM | POA: Insufficient documentation

## 2015-07-16 DIAGNOSIS — E7201 Cystinuria: Secondary | ICD-10-CM | POA: Diagnosis present

## 2015-07-16 DIAGNOSIS — Z841 Family history of disorders of kidney and ureter: Secondary | ICD-10-CM | POA: Diagnosis not present

## 2015-07-16 DIAGNOSIS — E7200 Disorders of amino-acid transport, unspecified: Secondary | ICD-10-CM

## 2015-07-16 DIAGNOSIS — N200 Calculus of kidney: Secondary | ICD-10-CM | POA: Insufficient documentation

## 2015-07-16 HISTORY — PX: CYSTOSCOPY WITH URETEROSCOPY, STONE BASKETRY AND STENT PLACEMENT: SHX6378

## 2015-07-16 HISTORY — PX: CYSTOSCOPY WITH RETROGRADE PYELOGRAM, URETEROSCOPY AND STENT PLACEMENT: SHX5789

## 2015-07-16 LAB — POCT I-STAT, CHEM 8
BUN: 14 mg/dL (ref 6–20)
Calcium, Ion: 1.16 mmol/L (ref 1.12–1.23)
Chloride: 103 mmol/L (ref 101–111)
Creatinine, Ser: 0.6 mg/dL (ref 0.44–1.00)
Glucose, Bld: 96 mg/dL (ref 65–99)
HCT: 47 % — ABNORMAL HIGH (ref 36.0–46.0)
Hemoglobin: 16 g/dL — ABNORMAL HIGH (ref 12.0–15.0)
Potassium: 4.1 mmol/L (ref 3.5–5.1)
Sodium: 139 mmol/L (ref 135–145)
TCO2: 23 mmol/L (ref 0–100)

## 2015-07-16 SURGERY — CYSTOURETEROSCOPY, WITH RETROGRADE PYELOGRAM AND STENT INSERTION
Anesthesia: General | Site: Ureter | Laterality: Right

## 2015-07-16 MED ORDER — EPHEDRINE SULFATE 50 MG/ML IJ SOLN
INTRAMUSCULAR | Status: DC | PRN
  Administered 2015-07-16: 10 mg via INTRAVENOUS

## 2015-07-16 MED ORDER — DEXAMETHASONE SODIUM PHOSPHATE 10 MG/ML IJ SOLN
INTRAMUSCULAR | Status: AC
  Filled 2015-07-16: qty 1

## 2015-07-16 MED ORDER — STERILE WATER FOR IRRIGATION IR SOLN
Status: DC | PRN
  Administered 2015-07-16: 500 mL

## 2015-07-16 MED ORDER — FENTANYL CITRATE (PF) 100 MCG/2ML IJ SOLN
INTRAMUSCULAR | Status: DC | PRN
  Administered 2015-07-16: 50 ug via INTRAVENOUS
  Administered 2015-07-16: 25 ug via INTRAVENOUS
  Administered 2015-07-16: 50 ug via INTRAVENOUS
  Administered 2015-07-16 (×3): 25 ug via INTRAVENOUS

## 2015-07-16 MED ORDER — SCOPOLAMINE 1 MG/3DAYS TD PT72
1.0000 | MEDICATED_PATCH | TRANSDERMAL | Status: DC
  Administered 2015-07-16: 1.5 mg via TRANSDERMAL
  Filled 2015-07-16: qty 1

## 2015-07-16 MED ORDER — METOCLOPRAMIDE HCL 5 MG/ML IJ SOLN
INTRAMUSCULAR | Status: DC | PRN
  Administered 2015-07-16: 10 mg via INTRAVENOUS

## 2015-07-16 MED ORDER — TAMSULOSIN HCL 0.4 MG PO CAPS
ORAL_CAPSULE | ORAL | Status: AC
  Filled 2015-07-16: qty 1

## 2015-07-16 MED ORDER — TAMSULOSIN HCL 0.4 MG PO CAPS
0.4000 mg | ORAL_CAPSULE | Freq: Once | ORAL | Status: AC
  Administered 2015-07-16: 0.4 mg via ORAL
  Filled 2015-07-16: qty 1

## 2015-07-16 MED ORDER — LIDOCAINE HCL (CARDIAC) 20 MG/ML IV SOLN
INTRAVENOUS | Status: DC | PRN
  Administered 2015-07-16: 100 mg via INTRAVENOUS

## 2015-07-16 MED ORDER — KETOROLAC TROMETHAMINE 30 MG/ML IJ SOLN
INTRAMUSCULAR | Status: DC | PRN
  Administered 2015-07-16: 30 mg via INTRAVENOUS

## 2015-07-16 MED ORDER — LACTATED RINGERS IV SOLN
INTRAVENOUS | Status: DC
  Administered 2015-07-16: 07:00:00 via INTRAVENOUS
  Filled 2015-07-16: qty 1000

## 2015-07-16 MED ORDER — PHENAZOPYRIDINE HCL 200 MG PO TABS
200.0000 mg | ORAL_TABLET | Freq: Once | ORAL | Status: AC
  Administered 2015-07-16: 200 mg via ORAL
  Filled 2015-07-16: qty 1

## 2015-07-16 MED ORDER — PROMETHAZINE HCL 25 MG/ML IJ SOLN
6.2500 mg | INTRAMUSCULAR | Status: DC | PRN
  Filled 2015-07-16: qty 1

## 2015-07-16 MED ORDER — SUCCINYLCHOLINE CHLORIDE 20 MG/ML IJ SOLN
INTRAMUSCULAR | Status: DC | PRN
  Administered 2015-07-16: 100 mg via INTRAVENOUS

## 2015-07-16 MED ORDER — FENTANYL CITRATE (PF) 100 MCG/2ML IJ SOLN
25.0000 ug | INTRAMUSCULAR | Status: DC | PRN
  Filled 2015-07-16: qty 1

## 2015-07-16 MED ORDER — ONDANSETRON HCL 4 MG/2ML IJ SOLN
INTRAMUSCULAR | Status: AC
  Filled 2015-07-16: qty 2

## 2015-07-16 MED ORDER — FENTANYL CITRATE (PF) 100 MCG/2ML IJ SOLN
INTRAMUSCULAR | Status: AC
  Filled 2015-07-16: qty 2

## 2015-07-16 MED ORDER — CIPROFLOXACIN IN D5W 400 MG/200ML IV SOLN
INTRAVENOUS | Status: AC
Start: 2015-07-16 — End: 2015-07-16
  Filled 2015-07-16: qty 200

## 2015-07-16 MED ORDER — DEXAMETHASONE SODIUM PHOSPHATE 10 MG/ML IJ SOLN
INTRAMUSCULAR | Status: DC | PRN
  Administered 2015-07-16: 10 mg via INTRAVENOUS

## 2015-07-16 MED ORDER — MIDAZOLAM HCL 5 MG/5ML IJ SOLN
INTRAMUSCULAR | Status: DC | PRN
  Administered 2015-07-16: 2 mg via INTRAVENOUS

## 2015-07-16 MED ORDER — PROPOFOL 10 MG/ML IV BOLUS
INTRAVENOUS | Status: DC | PRN
  Administered 2015-07-16: 180 mg via INTRAVENOUS

## 2015-07-16 MED ORDER — OXYBUTYNIN CHLORIDE 5 MG PO TABS
ORAL_TABLET | ORAL | Status: AC
  Filled 2015-07-16: qty 1

## 2015-07-16 MED ORDER — PHENAZOPYRIDINE HCL 100 MG PO TABS
ORAL_TABLET | ORAL | Status: AC
  Filled 2015-07-16: qty 2

## 2015-07-16 MED ORDER — SCOPOLAMINE 1 MG/3DAYS TD PT72
MEDICATED_PATCH | TRANSDERMAL | Status: AC
  Filled 2015-07-16: qty 1

## 2015-07-16 MED ORDER — METOCLOPRAMIDE HCL 5 MG/ML IJ SOLN
INTRAMUSCULAR | Status: AC
  Filled 2015-07-16: qty 2

## 2015-07-16 MED ORDER — KETOROLAC TROMETHAMINE 10 MG PO TABS: 10.0000 mg | ORAL_TABLET | Freq: Four times a day (QID) | ORAL | Status: DC | PRN

## 2015-07-16 MED ORDER — CIPROFLOXACIN IN D5W 400 MG/200ML IV SOLN
400.0000 mg | INTRAVENOUS | Status: AC
Start: 2015-07-16 — End: 2015-07-16
  Administered 2015-07-16: 400 mg via INTRAVENOUS
  Filled 2015-07-16: qty 200

## 2015-07-16 MED ORDER — OXYBUTYNIN CHLORIDE 5 MG PO TABS
5.0000 mg | ORAL_TABLET | Freq: Once | ORAL | Status: AC
  Administered 2015-07-16: 5 mg via ORAL
  Filled 2015-07-16: qty 1

## 2015-07-16 MED ORDER — SODIUM CHLORIDE 0.9 % IR SOLN
Status: DC | PRN
  Administered 2015-07-16: 4000 mL

## 2015-07-16 MED ORDER — MIDAZOLAM HCL 2 MG/2ML IJ SOLN
INTRAMUSCULAR | Status: AC
  Filled 2015-07-16: qty 2

## 2015-07-16 MED ORDER — ONDANSETRON HCL 4 MG/2ML IJ SOLN
INTRAMUSCULAR | Status: DC | PRN
  Administered 2015-07-16: 8 mg via INTRAVENOUS

## 2015-07-16 MED ORDER — ONDANSETRON HCL 4 MG/2ML IJ SOLN
INTRAMUSCULAR | Status: AC
Start: 2015-07-16 — End: 2015-07-16
  Filled 2015-07-16: qty 2

## 2015-07-16 MED ORDER — PROPOFOL 10 MG/ML IV BOLUS
INTRAVENOUS | Status: AC
  Filled 2015-07-16: qty 20

## 2015-07-16 MED ORDER — GLYCOPYRROLATE 0.2 MG/ML IJ SOLN: INTRAMUSCULAR | Status: DC | PRN

## 2015-07-16 MED ORDER — LIDOCAINE HCL (CARDIAC) 20 MG/ML IV SOLN
INTRAVENOUS | Status: AC
  Filled 2015-07-16: qty 5

## 2015-07-16 MED ORDER — LACTATED RINGERS IV SOLN
INTRAVENOUS | Status: DC
  Administered 2015-07-16: 10:00:00 via INTRAVENOUS
  Filled 2015-07-16: qty 1000

## 2015-07-16 SURGICAL SUPPLY — 35 items
ADAPTER CATH URET PLST 4-6FR (CATHETERS) IMPLANT
ADPR CATH URET STRL DISP 4-6FR (CATHETERS)
BAG DRAIN URO-CYSTO SKYTR STRL (DRAIN) ×3 IMPLANT
BAG DRN UROCATH (DRAIN) ×2
BASKET DAKOTA 1.9FR 11X120 (BASKET) ×2 IMPLANT
BASKET LASER NITINOL 1.9FR (BASKET) ×2 IMPLANT
BASKET ZERO TIP NITINOL 2.4FR (BASKET) IMPLANT
BSKT STON RTRVL 120 1.9FR (BASKET) ×2
BSKT STON RTRVL ZERO TP 2.4FR (BASKET)
CATH CLEAR GEL 3F BACKSTOP (CATHETERS) IMPLANT
CLOTH BEACON ORANGE TIMEOUT ST (SAFETY) ×3 IMPLANT
FIBER LASER FLEXIVA 550 (UROLOGICAL SUPPLIES) IMPLANT
FIBER LASER TRAC TIP (UROLOGICAL SUPPLIES) IMPLANT
GLOVE BIO SURGEON STRL SZ7.5 (GLOVE) ×2 IMPLANT
GLOVE BIO SURGEON STRL SZ8 (GLOVE) ×3 IMPLANT
GLOVE INDICATOR 7.5 STRL GRN (GLOVE) ×4 IMPLANT
GOWN STRL REUS W/ TWL LRG LVL3 (GOWN DISPOSABLE) ×2 IMPLANT
GOWN STRL REUS W/ TWL XL LVL3 (GOWN DISPOSABLE) ×2 IMPLANT
GOWN STRL REUS W/TWL LRG LVL3 (GOWN DISPOSABLE) ×3
GOWN STRL REUS W/TWL XL LVL3 (GOWN DISPOSABLE) ×3
GUIDEWIRE ANG ZIPWIRE 038X150 (WIRE) IMPLANT
GUIDEWIRE STR DUAL SENSOR (WIRE) ×3 IMPLANT
IV NS 1000ML (IV SOLUTION) ×3
IV NS 1000ML BAXH (IV SOLUTION) ×1 IMPLANT
IV NS IRRIG 3000ML ARTHROMATIC (IV SOLUTION) ×4 IMPLANT
KIT BALLIN UROMAX 15FX10 (LABEL) IMPLANT
KIT BALLN UROMAX 15FX4 (MISCELLANEOUS) IMPLANT
KIT BALLN UROMAX 26 75X4 (MISCELLANEOUS)
KIT ROOM TURNOVER WOR (KITS) ×3 IMPLANT
MANIFOLD NEPTUNE II (INSTRUMENTS) ×2 IMPLANT
PACK CYSTO (CUSTOM PROCEDURE TRAY) ×3 IMPLANT
SET HIGH PRES BAL DIL (LABEL)
SHEATH ACCESS URETERAL 38CM (SHEATH) ×2 IMPLANT
TUBE CONNECTING 12X1/4 (SUCTIONS) ×2 IMPLANT
WATER STERILE IRR 3000ML UROMA (IV SOLUTION) IMPLANT

## 2015-07-16 NOTE — Anesthesia Postprocedure Evaluation (Signed)
Anesthesia Post Note  Patient: Charisse Marchara C Kincannon  Procedure(s) Performed: Procedure(s) (LRB): CYSTOSCOPY WITH RETROGRADE PYELOGRAM (Right) CYSTOSCOPY WITH URETEROSCOPY, STONE BASKETRY AND STENT PLACEMENT (Right)  Patient location during evaluation: PACU Anesthesia Type: General Level of consciousness: awake and alert Pain management: pain level controlled Vital Signs Assessment: post-procedure vital signs reviewed and stable Respiratory status: spontaneous breathing, nonlabored ventilation, respiratory function stable and patient connected to nasal cannula oxygen Cardiovascular status: blood pressure returned to baseline and stable Postop Assessment: no signs of nausea or vomiting Anesthetic complications: no    Last Vitals:  Filed Vitals:   07/16/15 0930 07/16/15 1016  BP: 115/67 119/68  Pulse: 63 64  Temp:  36.5 C  Resp: 17 16    Last Pain: There were no vitals filed for this visit.               Phillips Groutarignan, Aron Needles

## 2015-07-16 NOTE — Discharge Instructions (Signed)

## 2015-07-16 NOTE — Transfer of Care (Signed)
Immediate Anesthesia Transfer of Care Note  Patient: [name] [name]  Procedure(s) Performed: Procedure(s) (LRB): RIGHT URETEROSCOPY AND STENT PLACEMENT (Right) HOLMIUM LASER LITHOTRIPSY (N/A)  Patient Location: PACU  Anesthesia Type: General  Level of Consciousness: awake, sedated, patient cooperative and responds to stimulation  Airway & Oxygen Therapy: Patient Spontanous Breathing and Patient connected to face mask oxygen  Post-op Assessment: Report given to PACU RN, Post -op Vital signs reviewed and stable and Patient moving all extremities  Post vital signs: Reviewed and stable  Complications: No apparent anesthesia complications

## 2015-07-16 NOTE — Anesthesia Procedure Notes (Signed)
Procedure Name: Intubation Date/Time: 07/16/2015 7:35 AM Performed by: Jessica PriestBEESON, Sania Noy C Pre-anesthesia Checklist: Patient identified, Emergency Drugs available, Suction available and Patient being monitored Patient Re-evaluated:Patient Re-evaluated prior to inductionOxygen Delivery Method: Circle System Utilized Preoxygenation: Pre-oxygenation with 100% oxygen Intubation Type: IV induction Ventilation: Mask ventilation without difficulty Laryngoscope Size: Mac and 4 Grade View: Grade II Tube type: Oral Tube size: 7.0 mm Number of attempts: 1 Airway Equipment and Method: Stylet and Oral airway Placement Confirmation: ETT inserted through vocal cords under direct vision,  positive ETCO2 and breath sounds checked- equal and bilateral Secured at: 20 cm Tube secured with: Tape Dental Injury: Teeth and Oropharynx as per pre-operative assessment

## 2015-07-16 NOTE — H&P (Signed)
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 She was on Thiola in the past and tolerated this. She's had multiple surgical procedures for her stones dating back to 2/95 when she underwent bilateral lithotripsy.   In 1/04 she had a left percutaneous nephrostolithotomy.   10/06 right ureteroscopy with the finding of multiple Randles plaques but no stone that could be extracted. She ended up having to have a stent placed later that same month for ureteral obstruction.   12/07 she underwent another left percutaneous nephrostolithotomy.   9/15 right PCNL for 2.5 cm stone.  1/17 right ureteroscopy and laser lithotripsy.  Previous pharmacologic treatments: She tolerated Thiola in the distant past. She was placed on captopril for sometime but this was not effective. Thiola therapy was reinitiated but caused palpitations and had to be discontinued.  Current therapy: KCit 30mEq. b.i.d.    Interval history: When I saw her postoperatively I obtained a CT scan which revealed there had been fragmentation of her stones although there were stones in the lower pole and it was difficult to know whether these were several small stones grouped together or larger in size. The stent was bothering her so I took her stent out and we initially had planned to proceed with ureteroscopic management of the remaining right renal calculi however over the next 48 hours she passed several stones of moderate size and therefore we decided to cancel the surgery. She passed one more stones since that time. She is asymptomatic today.   Past Medical History Problems  1. History of hypertension (Z86.79) 2. History of Murmur (R01.1)  Surgical History Problems  1. History of Cystoscopy Ureteroscopy Lithotripsy Incl Insert Indwelling Ureter Stent 2. History of Cystoscopy With Insertion Of Ureteral Stent Bilateral 3.  History of Cystoscopy With Insertion Of Ureteral Stent Left 4. History of Cystoscopy With Insertion Of Ureteral Stent Right 5. History of Cystoscopy With Pyeloscopy With Lithotripsy 6. History of Cystoscopy With Pyeloscopy With Removal Of Calculus 7. History of Cystoscopy With Ureteroscopy Right 8. History of Cystoscopy With Ureteroscopy With Lithotripsy 9. History of Percutaneous Lithotomy 10. History of Percutaneous Lithotomy 11. History of Percutaneous Lithotomy For Stone Over 2cm. 12. History of Renal Lithotripsy  Current Meds 1. Metoprolol Succinate ER 25 MG Oral Tablet Extended Release 24 Hour;  Therapy: 12Dec2014 to Recorded 2. Potassium Citrate ER 15 MEQ (1620 MG) Oral Tablet Extended Release; TAKE 2 TABLET  Twice daily;  Therapy: 22Feb2016 to (Evaluate:16Feb2017)  Requested for: 22Feb2016; Last  Rx:22Feb2016 Ordered  Allergies Medication  1. Bactrim TABS 2. Demerol TABS 3. Morphine Derivatives  Family History Problems  1. Family history of Death In The Family Father : Father   age 49; car accident 2. Family history of Family Health Status Number Of Children   1 son 3. Family history of Urologic Disorder  Social History Problems  1. Denied: Alcohol Use (History) 2. Caffeine Use 3. Family history of Death In The Family Father 4. Marital History - Currently Married 5. Never a smoker 6. 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 Denied: Tobacco Use      Vitals Vital Signs Height: 5 ft 4 in Weight: 144 lb  BMI Calculated: 24.72 BSA Calculated: 1.7 Blood Pressure: 104 / 61 Heart Rate: 66  Review of Systems Genitourinary, constitutional, skin, eye, otolaryngeal, hematologic/lymphatic, cardiovascular, pulmonary, endocrine, musculoskeletal, gastrointestinal, neurological and psychiatric system(s) were reviewed and pertinent findings if  present are noted.  Genitourinary: no hematuria.  Gastrointestinal: no flank pain.    Physical Exam Constitutional: Well nourished  and well developed . No acute distress.   ENT:. The ears and nose are normal in appearance.   Neck: The appearance of the neck is normal and no neck mass is present.   Pulmonary: No respiratory distress and normal respiratory rhythm and effort.   Cardiovascular:. No obvious murmurs are appreciated.   Abdomen: No suprapubic tenderness. No right CVA tenderness and left CVA tenderness.   Lymphatics: The posterior cervical, anterior cervical and supraclavicular nodes are not enlarged or tender.   Skin: Normal skin turgor, no visible rash and no visible skin lesions.   Neuro/Psych:. Mood and affect are appropriate.     The following images/tracing/specimen were independently visualized:  Right renal ultrasound: The right kidney measured 11 cm with 11 mm of parenchyma. There is no evidence of hydronephrosis or masses. A single calcification appeared to be located in an upper pole with what appeared to be at least 3-4 separate densities in the lower pole indicating that these are separate stones. She also had a cyst that was seen on CT as well.  The following clinical lab reports were reviewed:  UA: Clear with a pH of 7.5.    Assessment Assessed  1. Renal calculus, bilateral (N20.0)  I discussed her current situation with her. She does still have some fragments of stone primarily in the lower pole of her right kidney. It appears they are separate and not a single large stone as we had feared but it's been sometime since she's passed anymore stones and I did tell her that passing stones from the lower pole has a lower probability then from other locations within the kidney. The options would of course be to proceed with ureteroscopic removal of all remaining stones at this time versus continued observation with a repeat renal ultrasound in ~2 months. At that time if stones remained present they would likely not have increased in size significantly and could be managed ureteroscopically.  She  decided to proceed with repeat ureteroscopy.   Plan Rt. ureteroscopy, poss. laser litho., stone extraction and stent.

## 2015-07-16 NOTE — Anesthesia Preprocedure Evaluation (Addendum)
Anesthesia Evaluation  Patient identified by MRN, date of birth, ID band Patient awake    Reviewed: Allergy & Precautions, H&P , NPO status , Patient's Chart, lab work & pertinent test results, reviewed documented beta blocker date and time   History of Anesthesia Complications (+) PONV  Airway Mallampati: II  TM Distance: >3 FB Neck ROM: full    Dental no notable dental hx. (+) Teeth Intact, Dental Advisory Given   Pulmonary neg pulmonary ROS,    Pulmonary exam normal breath sounds clear to auscultation       Cardiovascular Exercise Tolerance: Good hypertension, Pt. on home beta blockers Normal cardiovascular exam+ dysrhythmias Supra Ventricular Tachycardia  Rhythm:regular Rate:Normal  PSVT    Neuro/Psych negative neurological ROS  negative psych ROS   GI/Hepatic negative GI ROS, Neg liver ROS,   Endo/Other  negative endocrine ROSBenign goiter  Renal/GU negative Renal ROS  negative genitourinary   Musculoskeletal   Abdominal   Peds  Hematology negative hematology ROS (+)   Anesthesia Other Findings Prosthetic right arm  Reproductive/Obstetrics negative OB ROS                           Anesthesia Physical  Anesthesia Plan  ASA: II  Anesthesia Plan: General   Post-op Pain Management:    Induction: Intravenous  Airway Management Planned: Oral ETT and LMA  Additional Equipment:   Intra-op Plan:   Post-operative Plan:   Informed Consent: I have reviewed the patients History and Physical, chart, labs and discussed the procedure including the risks, benefits and alternatives for the proposed anesthesia with the patient or authorized representative who has indicated his/her understanding and acceptance.   Dental Advisory Given  Plan Discussed with: CRNA and Surgeon  Anesthesia Plan Comments:        Anesthesia Quick Evaluation

## 2015-07-16 NOTE — Op Note (Signed)
PATIENT:  [name] [name]  PRE-OPERATIVE DIAGNOSIS: 1. Congenital cystinuria  2. Right renal calculi  POST-OPERATIVE DIAGNOSIS: Same  PROCEDURE: 1. Cystoscopy with right retrograde pyelogram including interpretation. 2. Right ureteroscopy with stone extraction. 3. Coagulum pyelolithotomy  SURGEON:  Garnett Farm  INDICATION: [name] [name] is a 50 year old female with a history of congenital cystinuria resulting in multiple kidney stones. She had the first procedure of a staged procedure recently and I was able to clear a very large volume of stone from her kidney however stone fragments persisted and we have discussed the treatment options and she has elected to proceed with a second stage look with removal of remaining stones.  ANESTHESIA:  General  EBL:  Minimal  DRAINS: None  LOCAL MEDICATIONS USED:  None  SPECIMEN:  None  Description of procedure: After informed consent the patient was taken to the operating room and placed on the table in a supine position. General anesthesia was then administered. Once fully anesthetized the patient was moved to the dorsal lithotomy position and the genitalia were sterilely prepped and draped in standard fashion. An official timeout was then performed.  The 23 French cystoscope was introduced and with a 30 lens the bladder was inspected and noted to be normal. The right ureteral orifice was identified and a 6 [place] open-ended ureteral catheter was then introduced into the right ureteral orifice in preparation for right retrograde pyelogram.  Right retrograde pyelogram was performed by injecting full-strength Omnipaque contrast through the open-ended catheter and up the right ureter which was noted be normal. The intrarenal collecting system appeared normal and no definite filling defects could be identified within the collecting system.  A 0.038 inch floppy-tipped guidewire was then passed through the open ended catheter and into the area  the renal pelvis and left in place while the open-ended catheter and cystoscope were removed. I passed the inner portion of a ureteral access sheath over the guidewire and it passed up the ureter very easily. I therefore passed the inner portion with the outer access sheath in place up the ureter and this progressed very smoothly and easily. I left the outer portion in place and removed the guidewire as well as inner portion and affixed the access sheath to the drape.  The flexible, digital ureteroscope was then passed through the access sheath and into her right kidney under direct visualization. I did a systematic inspection of each calyx. I started in the upper pole and noted a small stone fragment. I used the [place] basket to engage this stone fragment and extract it without difficulty. I then continued with my systematic survey of the calyces and the remaining upper pole calyces were noted be free of any stones. The middle pole revealed a single stone that appeared to be somewhat adherent to a renal papilla and I thought it might need to be lasered free from the location so I continued with my survey and in the lower pole found a group of stones measuring in size from approximately 2-3 millimeters down to several small fragments that were 1 mm or less in size. I was able to engage the largest of the fragments in the basket and removed them but there were multiple small fragments within the lower pole too small to be grasped with the basket. I therefore had anesthesia drop blood from the patient and injected approximately 5 mL of the patient's blood through the ureteroscope and into the lower pole. I let this form a coagulum for  10 minutes. I then used the [place] basket to extract the coagulum with the stones contained within the coagulum and once the coagulum had been completely removed and full visualization was re-stored I was able to see that all of the stone fragments had been removed.  I turned my  attention to the middle pole calyx and tried to engage the stone with the [place] basket was unsuccessful. I switched to a 0 tip nitinol basket and was eventually able to get this beyond the stone into the calyx and engage a portion of the stone. I was able pull it out and it turned out it was a fairly large piece that had lodged between the papilla and the calyx in the fornix. I was able to grab the stone and remove it from this location and dropped it in the renal pelvis and then re-engage the stone and was able to extract it without difficulty. I therefore removed the ureteroscope and then I removed the access sheath which I was able to remove very easily with no resistance and therefore in this case did not feel the need to place a stent afterwards. There was very little to no bleeding and the patient tolerated this procedure well with no intraoperative complications. She was awakened and taken to the recovery room in stable and satisfactory condition.  PLAN OF CARE: Discharge to home after PACU  PATIENT DISPOSITION:  PACU - hemodynamically stable.

## 2015-07-20 ENCOUNTER — Encounter (HOSPITAL_BASED_OUTPATIENT_CLINIC_OR_DEPARTMENT_OTHER): Payer: Self-pay | Admitting: Urology

## 2015-08-17 ENCOUNTER — Other Ambulatory Visit: Payer: Self-pay

## 2015-08-17 DIAGNOSIS — Z1231 Encounter for screening mammogram for malignant neoplasm of breast: Secondary | ICD-10-CM

## 2015-11-05 ENCOUNTER — Ambulatory Visit
Admission: RE | Admit: 2015-11-05 | Discharge: 2015-11-05 | Disposition: A | Discharge status: Home / Self Care | Payer: BLUE CROSS/BLUE SHIELD | Source: Ambulatory Visit

## 2015-11-05 DIAGNOSIS — Z1231 Encounter for screening mammogram for malignant neoplasm of breast: Secondary | ICD-10-CM | POA: Diagnosis not present

## 2015-12-06 ENCOUNTER — Other Ambulatory Visit: Payer: Self-pay | Admitting: Obstetrics & Gynecology

## 2015-12-08 LAB — CYTOLOGY - PAP

## 2016-04-07 ENCOUNTER — Encounter: Payer: Self-pay | Admitting: Physician Assistant

## 2016-04-07 ENCOUNTER — Ambulatory Visit (INDEPENDENT_AMBULATORY_CARE_PROVIDER_SITE_OTHER): Payer: BLUE CROSS/BLUE SHIELD | Admitting: Physician Assistant

## 2016-04-07 VITALS — BP 111/73 | HR 59 | Ht 64.0 in | Wt 165.4 lb

## 2016-04-07 DIAGNOSIS — I10 Essential (primary) hypertension: Secondary | ICD-10-CM

## 2016-04-07 DIAGNOSIS — I471 Supraventricular tachycardia: Secondary | ICD-10-CM | POA: Diagnosis not present

## 2016-04-07 MED ORDER — METOPROLOL SUCCINATE ER 50 MG PO TB24: 50.0000 mg | ORAL_TABLET | Freq: Every day | ORAL | 11 refills | Status: DC

## 2016-04-07 NOTE — Patient Instructions (Signed)
Medication Instructions:  Your physician recommends that you continue on your current medications as directed. Please refer to the Current Medication list given to you today.  We have refilled your Metoprolol today  Labwork: None ordered  Testing/Procedures: None ordered  Follow-Up: Your physician wants you to follow-up in: 1 year with Dr.Crenshaw You will receive a reminder letter in the mail two months in advance. If you don't receive a letter, please call our office to schedule the follow-up appointment.   Any Other Special Instructions Will Be Listed Below (If Applicable).     If you need a refill on your cardiac medications before your next appointment, please call your pharmacy.

## 2016-04-07 NOTE — Progress Notes (Signed)
Cardiology Office Note   Date:  04/07/2016   ID:  [name] [name], DOB 28-May-1965, MRN [id number]  PCP:  No primary care provider on file.  Cardiologist:  Dr Jens Somrenshaw 03/2015 [name] [name], [name] Lumadue, PA-C    History of Present Illness: [name] [name] is a 50 y.o. female with a history of SVT, HTN, nl EF by echo, nl MV 2003, thyroid goiter, RUE absence, kidney stones  [name] [name] presents for yearly followup.   Pt belongs to a gym and does lots of cardio. She is working hard right now, getting ready to help [place]. Dr Dorrene GermanSusan Butler will be the MD.  She never gets chest pain. She has no SOB or DOE. No LE edema.  If she forgets to take the metoprolol (takes at bedtime), she will have palpitations by the next morning. However, as long as she takes the metoprolol, she does very well. Her HR is in the 50s at rest, she is asymptomatic with this.  She has no other issues or concerns.   Past Medical History:  Diagnosis Date  . Congenital absence of right upper extremity    WEARS PROSTHESIS  RIGHT ARM  . History of kidney stones    CYSTINE STONES  . Hypertension   . Multiple thyroid nodules    W/ GOITER--  LAST BX  BENIGN  . PONV (postoperative nausea and vomiting)    PT STATES ZOFRAN IN SURGERY HELPS  . PSVT (paroxysmal supraventricular tachycardia) (HCC)    CARDIOLOGIST --  DR CRENSHAW  "NO PROBLEMS IN LONG TIME" WITH FAST HEART RATE  . Renal calculus, right   . Scarlet fever with other complications 1970    Past Surgical History:  Procedure Laterality Date  . CYSTO/  BILATERAL URETEROSCOPIC LASER LITHOTRIPSY/  BILATERAL URETERAL STONE EXTRACTION/  BILATERAL STENT PLACEMENT  01-22-2008  . CYSTO/  RIGHT RETROGRADE PYELOGRAM/ RIGHT URETEROSCOPY  02-10-2005/   02-17-2005/   03-18-2001   02-17-2005-- STENT PLACEMENT  . CYSTO/  RIGHT URETERAL STENT PLACEMENT  07-10-2000  . CYSTOSCOPY W/ RETROGRADES Right 05/21/2015   Procedure: CYSTOSCOPY WITH  RETROGRADE PYELOGRAM;  Surgeon: Ihor GullyMark Ottelin, MD;  [facility] [place];  Service: Urology;  Laterality: Right;  . CYSTOSCOPY WITH RETROGRADE PYELOGRAM, URETEROSCOPY AND STENT PLACEMENT Left 09/25/2013   Procedure: LEFT RETROGRADE PYELOGRAM, URETEROSCOPY LASER LITHO AND STENT PLACEMENT;  Surgeon: Garnett FarmMark C Ottelin, MD;  [facility] [place];  Service: Urology;  Laterality: Left;  . CYSTOSCOPY WITH RETROGRADE PYELOGRAM, URETEROSCOPY AND STENT PLACEMENT Right 07/16/2015   Procedure: CYSTOSCOPY WITH RETROGRADE PYELOGRAM;  Surgeon: Ihor GullyMark Ottelin, MD;  [facility] [place];  Service: Urology;  Laterality: Right;  . CYSTOSCOPY WITH URETEROSCOPY, STONE BASKETRY AND STENT PLACEMENT Right 07/16/2015   Procedure: CYSTOSCOPY WITH URETEROSCOPY, STONE BASKETRY;  Surgeon: Ihor GullyMark Ottelin, MD;  [facility] [place];  Service: Urology;  Laterality: Right;  . CYSTOSCOPY/URETEROSCOPY/HOLMIUM LASER/STENT PLACEMENT Right 05/21/2015   Procedure: RIGHT URETEROSCOPY/HOLMIUM LASER LITHOTRIPSY/RIGHT STENT PLACEMENT;  Surgeon: Ihor GullyMark Ottelin, MD;  [facility] [place];  Service: Urology;  Laterality: Right;  . CYTSO/  LEFT URETERAL STENT PLACEMENT  04-12-2006  &  06-05-2002  . DILATION AND CURETTAGE OF UTERUS  04-09-2004   W/  SUCTION  . HOLMIUM LASER APPLICATION Left 09/25/2013   Procedure: HOLMIUM LASER APPLICATION;  Surgeon: Garnett FarmMark C Ottelin, MD;  [facility] [place];  Service: Urology;  Laterality: Left;  . HOLMIUM LASER APPLICATION N/A 12/15/2013  Procedure: HOLMIUM LASER APPLICATION;  Surgeon: Garnett FarmMark C Ottelin, MD;  Location: [facility];  Service: Urology;  Laterality: N/A;  . NEPHROLITHOTOMY Right 12/15/2013   Procedure: RIGHT PERCUTANEOUS NEPHROLITHOTOMY ;  Surgeon: Garnett FarmMark C Ottelin, MD;  Location: [facility];  Service: Urology;  Laterality: Right;   [place]  . PERCUTANEOUS NEPHROSTOLITHOTOMY Bilateral LEFT  05-30-2002/   RIGHT 12-13-1999  . RIGHT  URETEROSCOPIC STONE EXTRACTION / STENT PLACEMENT  07-03-2000      Allergies:   Demerol [meperidine hcl]; Morphine; and Bactrim [sulfamethoxazole-trimethoprim]    Social History:  The patient  reports that she has never smoked. She has never used smokeless tobacco. She reports that she drinks alcohol. She reports that she does not use drugs.   Family History:  The patient's family history includes Healthy in her brother and mother.    ROS:  Please see the history of present illness. All other systems are reviewed and negative.    PHYSICAL EXAM: VS:  BP 111/73   Pulse (!) 59   Ht 5\' 4"  (1.626 m)   Wt 165 lb 6.4 oz (75 kg)   BMI 28.39 kg/m  , BMI Body mass index is 28.39 kg/m. GEN: Well nourished, well developed, female in no acute distress  HEENT: normal for age  Neck: no JVD, no carotid bruit, no masses Cardiac: RRR; no murmur, no rubs, or gallops Respiratory:  clear to auscultation bilaterally, normal work of breathing GI: soft, nontender, nondistended, + BS MS: no deformity or atrophy; no edema; distal pulses are 2+ in all 3 extremities; R arm prosthesis in place   Skin: warm and dry, no rash Neuro:  Strength and sensation are intact Psych: euthymic mood, full affect   EKG:  EKG is ordered today. The ekg ordered today demonstrates Sinus brady, HR 59, ?early repol, normal intervals   Recent Labs: 07/16/2015: BUN 14; Creatinine, Ser 0.60; Hemoglobin 16.0; Potassium 4.1; Sodium 139    Lipid Panel No results found for: CHOL, TRIG, HDL, CHOLHDL, VLDL, LDLCALC, LDLDIRECT   Wt Readings from Last 3 Encounters:  04/07/16 165 lb 6.4 oz (75 kg)  07/16/15 152 lb 8 oz (69.2 kg)  05/21/15 148 lb 8 oz (67.4 kg)     Other studies Reviewed: Additional studies/ records that were reviewed today include: office notes and previous testing.  ASSESSMENT AND PLAN:  1.  SVT: Pt is doing well on the metoprolol, no dose change. She feels good on the metoprolol. Her BP is  well-controlled. She is not interested in ablation. No further testing needed.   2. HTN: BP well-controlled on the BB, no med changes  Current medicines are reviewed at length with the patient today.  The patient does not have concerns regarding medicines.  The following changes have been made:  no change  Labs/ tests ordered today include:   Orders Placed This Encounter  Procedures  . EKG 12-Lead     Disposition:   FU with Dr Jens Somrenshaw  Signed, [name] [name], Katelan Hirt, PA-C  04/07/2016 8:49 AM    [facility] Phone: [phone]([phone]; Fax: [phone]) [phone]  This note was written with the assistance of speech recognition software. Please excuse any transcriptional errors.

## 2016-08-18 DIAGNOSIS — N200 Calculus of kidney: Secondary | ICD-10-CM | POA: Diagnosis not present

## 2017-01-10 ENCOUNTER — Telehealth: Payer: Self-pay | Admitting: Cardiology

## 2017-01-10 NOTE — Telephone Encounter (Signed)
New message    Pt is calling stating that she is having an afib issues right now. She said she had an ekg at her job this morning and it showed it. She said she has some chest tightness. Please call.

## 2017-01-10 NOTE — Telephone Encounter (Signed)
ECG's received and reviewed with dr Swazilandjordan, ECG shows sinus rhythm with PVC's. Spoke with pt, reassurance given to pt. She reports her bp is 109/70. She states this is usually where her bp runs. She feels well hydrated and has not had any change in diet or anything else. She has a follow up appt with dr Jens Somcrenshaw on Monday. She will call back with further problems. Will review ECG's with dr Jens Somcrenshaw tomorrow.

## 2017-01-10 NOTE — Telephone Encounter (Signed)
Spoke with pt, she reports an episode of heart skipping on Sunday. Another episode on Monday and none Tuesday. Today while sitting at her desk on the phone she noticed the skipping again. 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 She reports atrial fib when sitting upright. She is feeling some better but continues to have the skipping. She will get her bp checked and fax that and the EKG to me. She is c/o fluttering sensation in her chest, SOB and feeling lightheaded. No chest pain. Will await ECG.

## 2017-01-11 NOTE — Progress Notes (Signed)
HPI: FU hypertension, SVT. According to previous notes when she was cared for by Dr Corinda Gubler, she has a history of SVT although I do not have actual strips. A previous echocardiogram in September 2003, showed normal LV function and a Myoview performed in September 2003, showed no scar or ischemia with an ejection fraction 74%. Patient contacted the office on September 5 with complaints of palpitations. Electrocardiogram was performed and revealed sinus rhythm with PVCs. Since I last saw her, she had palpitations over the preceding week. They're described as skipped followed by pause. She had mild dizziness but no syncope. There is associated chest heaviness. She has not had further symptoms in the past 4 days. She otherwise typically does not have dyspnea on exertion, orthopnea, PND, pedal edema, syncope or exertional chest pain.  Current Outpatient Prescriptions  Medication Sig Dispense Refill  . metoprolol succinate (TOPROL-XL) 50 MG 24 hr tablet Take 1 tablet (50 mg total) by mouth at bedtime. Take with or immediately following a meal. 30 tablet 11  . potassium citrate (UROCIT-K) 10 MEQ (1080 MG) SR tablet Take 20 mEq by mouth daily.     No current facility-administered medications for this visit.      Past Medical History:  Diagnosis Date  . Congenital absence of right upper extremity    WEARS PROSTHESIS  RIGHT ARM  . History of kidney stones    CYSTINE STONES  . Hypertension   . Multiple thyroid nodules    W/ GOITER--  LAST BX  BENIGN  . PONV (postoperative nausea and vomiting)    PT STATES ZOFRAN IN SURGERY HELPS  . PSVT (paroxysmal supraventricular tachycardia) (HCC)    CARDIOLOGIST --  DR CRENSHAW  "NO PROBLEMS IN LONG TIME" WITH FAST HEART RATE  . Renal calculus, right   . Scarlet fever with other complications 1970    Past Surgical History:  Procedure Laterality Date  . CYSTO/  BILATERAL URETEROSCOPIC LASER LITHOTRIPSY/  BILATERAL URETERAL STONE EXTRACTION/  BILATERAL  STENT PLACEMENT  01-22-2008  . CYSTO/  RIGHT RETROGRADE PYELOGRAM/ RIGHT URETEROSCOPY  02-10-2005/   02-17-2005/   03-18-2001   02-17-2005-- STENT PLACEMENT  . CYSTO/  RIGHT URETERAL STENT PLACEMENT  07-10-2000  . CYSTOSCOPY W/ RETROGRADES Right 05/21/2015   Procedure: CYSTOSCOPY WITH RETROGRADE PYELOGRAM;  Surgeon: Ihor Gully, MD;  Location: [facility];  Service: Urology;  Laterality: Right;  . CYSTOSCOPY WITH RETROGRADE PYELOGRAM, URETEROSCOPY AND STENT PLACEMENT Left 09/25/2013   Procedure: LEFT RETROGRADE PYELOGRAM, URETEROSCOPY LASER LITHO AND STENT PLACEMENT;  Surgeon: Garnett Farm, MD;  Location: [facility];  Service: Urology;  Laterality: Left;  . CYSTOSCOPY WITH RETROGRADE PYELOGRAM, URETEROSCOPY AND STENT PLACEMENT Right 07/16/2015   Procedure: CYSTOSCOPY WITH RETROGRADE PYELOGRAM;  Surgeon: Ihor Gully, MD;  Location: [facility];  Service: Urology;  Laterality: Right;  . CYSTOSCOPY WITH URETEROSCOPY, STONE BASKETRY AND STENT PLACEMENT Right 07/16/2015   Procedure: CYSTOSCOPY WITH URETEROSCOPY, STONE BASKETRY;  Surgeon: Ihor Gully, MD;  Location: [facility];  Service: Urology;  Laterality: Right;  . CYSTOSCOPY/URETEROSCOPY/HOLMIUM LASER/STENT PLACEMENT Right 05/21/2015   Procedure: RIGHT URETEROSCOPY/HOLMIUM LASER LITHOTRIPSY/RIGHT STENT PLACEMENT;  Surgeon: Ihor Gully, MD;  Location: [facility];  Service: Urology;  Laterality: Right;  . CYTSO/  LEFT URETERAL STENT PLACEMENT  04-12-2006  &  06-05-2002  . DILATION AND CURETTAGE OF UTERUS  04-09-2004   W/  SUCTION  . HOLMIUM LASER APPLICATION Left 09/25/2013   Procedure: HOLMIUM LASER  APPLICATION;  Surgeon: Garnett FarmMark C Ottelin, MD;  [facility];  Service: Urology;  Laterality: Left;  . HOLMIUM LASER APPLICATION N/A 12/15/2013   Procedure: HOLMIUM LASER APPLICATION;  Surgeon: Garnett FarmMark C Ottelin, MD;  Location: [facility];  Service: Urology;   Laterality: N/A;  . NEPHROLITHOTOMY Right 12/15/2013   Procedure: RIGHT PERCUTANEOUS NEPHROLITHOTOMY ;  Surgeon: Garnett FarmMark C Ottelin, MD;  Location: [facility];  Service: Urology;  Laterality: Right;   [place]  . PERCUTANEOUS NEPHROSTOLITHOTOMY Bilateral LEFT  05-30-2002/   RIGHT 12-13-1999  . RIGHT URETEROSCOPIC STONE EXTRACTION / STENT PLACEMENT  07-03-2000    Social History   Social History  . Marital status: Married    Spouse name: N/A  . Number of children: N/A  . Years of education: N/A   Occupational History  . 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 Smoking status: Never Smoker  . Smokeless tobacco: Never Used  . Alcohol use Yes     Comment: rarely  . Drug use: No  . Sexual activity: Not on file   Other Topics Concern  . Not on file   Social History Narrative  . No narrative on file    Family History  Problem Relation Age of Onset  . Healthy Mother   . Healthy Brother     ROS: no fevers or chills, productive cough, hemoptysis, dysphasia, odynophagia, melena, hematochezia, dysuria, hematuria, rash, seizure activity, orthopnea, PND, pedal edema, claudication. Remaining systems are negative.  Physical Exam: Well-developed well-nourished in no acute distress.  Skin is warm and dry.  HEENT is normal.  Neck is supple.  Chest is clear to auscultation with normal expansion.  Cardiovascular exam is regular rate and rhythm.  Abdominal exam nontender or distended. No masses palpated. Extremities show no edema. neuro grossly intact  ECG- 01/10/2017-sinus rhythm with occasional PVCs. No ST changes. personally reviewed  A/P  1 Supraventricular tachycardia-no recent bouts. Continue beta blocker.  2 palpitations-her electrocardiogram at time of symptoms did not show atrial fibrillation. There was evidence of PVCs. We will plan to continue metoprolol. She will take an additional 25 mg daily as needed. I'm hesitant to  advance dose on a routine basis as her blood pressure is borderline. We will plan to repeat echocardiogram. Check magnesium, TSH and potassium. We can consider a monitor in the future if her symptoms worsen.  3 hypertension-blood pressure is controlled. Continue present medications.  Olga MillersBrian Crenshaw, MD

## 2017-01-11 NOTE — Telephone Encounter (Signed)
Left message for patient, ECG's reviewed by dr Jens Somcrenshaw, no changes at this time. Left message for pt to call if needed.

## 2017-01-15 ENCOUNTER — Encounter: Payer: Self-pay | Admitting: Cardiology

## 2017-01-15 ENCOUNTER — Ambulatory Visit (INDEPENDENT_AMBULATORY_CARE_PROVIDER_SITE_OTHER): Payer: BLUE CROSS/BLUE SHIELD | Admitting: Cardiology

## 2017-01-15 VITALS — BP 108/64 | HR 62 | Ht 64.0 in | Wt 166.0 lb

## 2017-01-15 DIAGNOSIS — R002 Palpitations: Secondary | ICD-10-CM | POA: Diagnosis not present

## 2017-01-15 DIAGNOSIS — I10 Essential (primary) hypertension: Secondary | ICD-10-CM | POA: Diagnosis not present

## 2017-01-15 DIAGNOSIS — I471 Supraventricular tachycardia: Secondary | ICD-10-CM

## 2017-01-15 LAB — TSH: TSH: 1.65 u[IU]/mL (ref 0.450–4.500)

## 2017-01-15 LAB — BASIC METABOLIC PANEL
BUN / CREAT RATIO: 19 (ref 9–23)
BUN: 14 mg/dL (ref 6–24)
CO2: 24 mmol/L (ref 20–29)
CREATININE: 0.72 mg/dL (ref 0.57–1.00)
Calcium: 8.7 mg/dL (ref 8.7–10.2)
Chloride: 102 mmol/L (ref 96–106)
GFR, EST AFRICAN AMERICAN: 113 mL/min/{1.73_m2} (ref >59)
GFR, EST NON AFRICAN AMERICAN: 98 mL/min/{1.73_m2} (ref >59)
Glucose: 91 mg/dL (ref 65–99)
Potassium: 4.5 mmol/L (ref 3.5–5.2)
Sodium: 137 mmol/L (ref 134–144)

## 2017-01-15 LAB — MAGNESIUM: Magnesium: 2.1 mg/dL (ref 1.6–2.3)

## 2017-01-15 NOTE — Patient Instructions (Signed)

## 2017-01-17 ENCOUNTER — Encounter: Payer: Self-pay | Admitting: *Deleted

## 2017-01-25 ENCOUNTER — Other Ambulatory Visit: Payer: Self-pay | Admitting: Obstetrics & Gynecology

## 2017-01-25 DIAGNOSIS — Z1231 Encounter for screening mammogram for malignant neoplasm of breast: Secondary | ICD-10-CM

## 2017-02-23 ENCOUNTER — Other Ambulatory Visit (HOSPITAL_COMMUNITY): Payer: BLUE CROSS/BLUE SHIELD

## 2017-02-23 ENCOUNTER — Ambulatory Visit: Payer: BLUE CROSS/BLUE SHIELD

## 2017-03-01 ENCOUNTER — Ambulatory Visit
Admission: RE | Admit: 2017-03-01 | Discharge: 2017-03-01 | Disposition: A | Discharge status: Home / Self Care | Payer: BLUE CROSS/BLUE SHIELD | Source: Ambulatory Visit | Attending: Obstetrics & Gynecology | Admitting: Obstetrics & Gynecology

## 2017-03-01 ENCOUNTER — Other Ambulatory Visit: Payer: Self-pay

## 2017-03-01 ENCOUNTER — Ambulatory Visit (HOSPITAL_COMMUNITY): Payer: BLUE CROSS/BLUE SHIELD | Attending: Cardiovascular Disease

## 2017-03-01 DIAGNOSIS — N281 Cyst of kidney, acquired: Secondary | ICD-10-CM | POA: Diagnosis not present

## 2017-03-01 DIAGNOSIS — R002 Palpitations: Secondary | ICD-10-CM

## 2017-03-01 DIAGNOSIS — I471 Supraventricular tachycardia: Secondary | ICD-10-CM | POA: Diagnosis not present

## 2017-03-01 DIAGNOSIS — Z1231 Encounter for screening mammogram for malignant neoplasm of breast: Secondary | ICD-10-CM

## 2017-03-01 DIAGNOSIS — I10 Essential (primary) hypertension: Secondary | ICD-10-CM | POA: Insufficient documentation

## 2017-03-01 DIAGNOSIS — N200 Calculus of kidney: Secondary | ICD-10-CM | POA: Diagnosis not present

## 2017-04-07 ENCOUNTER — Other Ambulatory Visit: Payer: Self-pay | Admitting: Physician Assistant

## 2017-05-10 ENCOUNTER — Other Ambulatory Visit: Payer: Self-pay | Admitting: Cardiology

## 2017-05-10 ENCOUNTER — Telehealth: Payer: Self-pay

## 2017-05-10 NOTE — Telephone Encounter (Signed)
[place] forms back from [place], [place] (Dr. Jens Somrenshaw). Fax[place] forms to [employer] [place]. Spoke with patient. Patient is aware forms have been faxed and her copy will be mailed to her address. 05/10/17 ab

## 2017-06-13 ENCOUNTER — Other Ambulatory Visit: Payer: Self-pay | Admitting: Physician Assistant

## 2017-06-14 NOTE — Telephone Encounter (Signed)
Rx has been sent to the pharmacy electronically. ° °

## 2017-08-31 DIAGNOSIS — Q7100 Congenital complete absence of unspecified upper limb: Secondary | ICD-10-CM | POA: Diagnosis not present

## 2018-01-10 ENCOUNTER — Other Ambulatory Visit: Payer: Self-pay | Admitting: Cardiology

## 2018-02-07 ENCOUNTER — Telehealth: Payer: Self-pay | Admitting: Cardiology

## 2018-02-07 MED ORDER — METOPROLOL SUCCINATE ER 50 MG PO TB24: 50.0000 mg | ORAL_TABLET | Freq: Every day | ORAL | 0 refills | Status: DC

## 2018-02-07 NOTE — Telephone Encounter (Signed)
New Message:      *STAT* If patient is at the pharmacy, call can be transferred to refill team.   1. Which medications need to be refilled? (please list name of each medication and dose if known) metoprolol succinate (TOPROL-XL) 50 MG 24 hr tablet  2. Which pharmacy/location (including street and city if local pharmacy) is medication to be sent to? [facility] - [place] - [address]  3. Do they need a 30 day or 90 day supply? 30 Days

## 2018-02-14 ENCOUNTER — Other Ambulatory Visit: Payer: Self-pay | Admitting: Obstetrics & Gynecology

## 2018-02-14 DIAGNOSIS — Z1231 Encounter for screening mammogram for malignant neoplasm of breast: Secondary | ICD-10-CM

## 2018-03-08 ENCOUNTER — Encounter: Payer: Self-pay | Admitting: Cardiology

## 2018-03-08 ENCOUNTER — Ambulatory Visit: Payer: BLUE CROSS/BLUE SHIELD | Admitting: Cardiology

## 2018-03-08 DIAGNOSIS — R002 Palpitations: Secondary | ICD-10-CM | POA: Diagnosis not present

## 2018-03-08 DIAGNOSIS — Z8679 Personal history of other diseases of the circulatory system: Secondary | ICD-10-CM

## 2018-03-08 MED ORDER — METOPROLOL SUCCINATE ER 50 MG PO TB24: 50.0000 mg | ORAL_TABLET | Freq: Every day | ORAL | 3 refills | Status: DC

## 2018-03-08 NOTE — Assessment & Plan Note (Signed)
Remote PSVT treated with beta blcoker

## 2018-03-08 NOTE — Patient Instructions (Signed)
Medication Instructions:  Your physician recommends that you continue on your current medications as directed. Please refer to the Current Medication list given to you today. If you need a refill on your cardiac medications before your next appointment, please call your pharmacy.   Lab work: None  If you have labs (blood work) drawn today and your tests are completely normal, you will receive your results only by: Marland Kitchen MyChart Message (if you have MyChart) OR . A paper copy in the mail If you have any lab test that is abnormal or we need to change your treatment, we will call you to review the results.  Testing/Procedures: none  Follow-Up: At [facility], you and your health needs are our priority.  As part of our continuing mission to provide you with exceptional heart care, we have created designated Provider Care Teams.  These Care Teams include your primary Cardiologist (physician) and Advanced Practice Providers (APPs -  Physician Assistants and Nurse Practitioners) who all work together to provide you with the care you need, when you need it. You will need a follow up appointment in 12 months.  Please call our office 2 months in advance to schedule this appointment.  You may see Olga Millers, MD or one of the following Advanced Practice Providers on your designated Care Team:   Corine Shelter, PA-C Judy Pimple, [place] . Marjie Skiff, PA-C  Any Other Special Instructions Will Be Listed Below (If Applicable).

## 2018-03-08 NOTE — Progress Notes (Signed)
03/08/2018 [name] [name]   01/05/1966  [id number]  Primary Physician Ernestina Penna, MD Primary Cardiologist: Dr Jens Som  HPI:  [name] with a history of palpitations and remote PSVT, treated with beta blocker. She is here today for medication refill. As long as she takes her Toprol she has no issues with palpitations. Echo Oct 2018 showed normal LVF with mild LAE.    Current Outpatient Medications  Medication Sig Dispense Refill  . metoprolol succinate (TOPROL-XL) 50 MG 24 hr tablet Take 1 tablet (50 mg total) by mouth at bedtime. Take with or immediately following a meal. 30 tablet 0  . potassium citrate (UROCIT-K) 10 MEQ (1080 MG) SR tablet Take 20 mEq by mouth daily.     No current facility-administered medications for this visit.     Allergies  Allergen Reactions  . Demerol [Meperidine Hcl] Nausea And Vomiting  . Morphine Nausea And Vomiting  . Bactrim [Sulfamethoxazole-Trimethoprim] Hives    Past Medical History:  Diagnosis Date  . Congenital absence of right upper extremity    WEARS PROSTHESIS  RIGHT ARM  . History of kidney stones    CYSTINE STONES  . Hypertension   . Multiple thyroid nodules    W/ GOITER--  LAST BX  BENIGN  . PONV (postoperative nausea and vomiting)    PT STATES ZOFRAN IN SURGERY HELPS  . PSVT (paroxysmal supraventricular tachycardia) (HCC)    CARDIOLOGIST --  DR CRENSHAW  "NO PROBLEMS IN LONG TIME" WITH FAST HEART RATE  . Renal calculus, right   . Scarlet fever with other complications 1970    Social History   Socioeconomic History  . Marital status: Married    Spouse name: Not on file  . Number of children: Not on file  . Years of education: Not on file  . Highest education level: Not on file  Occupational History  . 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 Financial resource strain: Not on file  . Food insecurity:    Worry: Not on file    Inability: Not on  file  . Transportation needs:    Medical: Not on file    Non-medical: Not on file  Tobacco Use  . Smoking status: Never Smoker  . Smokeless tobacco: Never Used  Substance and Sexual Activity  . Alcohol use: Yes    Comment: rarely  . Drug use: No  . Sexual activity: Not on file  Lifestyle  . Physical activity:    Days per week: Not on file    Minutes per session: Not on file  . Stress: Not on file  Relationships  . Social connections:    Talks on phone: Not on file    Gets together: Not on file    Attends religious service: Not on file    Active member of club or organization: Not on file    Attends meetings of clubs or organizations: Not on file    Relationship status: Not on file  . Intimate partner violence:    Fear of current or ex partner: Not on file    Emotionally abused: Not on file    Physically abused: Not on file    Forced sexual activity: Not on file  Other Topics Concern  . Not on file  Social History Narrative  . Not on file     Family History  Problem Relation Age of Onset  . Healthy Mother   .  Healthy Brother      Review of Systems: General: negative for chills, fever, night sweats or weight changes.  Cardiovascular: negative for chest pain, dyspnea on exertion, edema, orthopnea, palpitations, paroxysmal nocturnal dyspnea or shortness of breath Dermatological: negative for rash Respiratory: negative for cough or wheezing Urologic: negative for hematuria Abdominal: negative for nausea, vomiting, diarrhea, bright red blood per rectum, melena, or hematemesis Neurologic: negative for visual changes, syncope, or dizziness All other systems reviewed and are otherwise negative except as noted above.    Blood pressure 115/70, pulse 64, resp. rate 16, height 5\' 4"  (1.626 m), weight 156 lb 6.4 oz (70.9 kg), SpO2 99 %.  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 No rashes or lesions Neurologic: Grossly normal   ASSESSMENT AND PLAN:   History of PSVT (paroxysmal supraventricular tachycardia) Remote PSVT treated with beta blcoker  Palpitations Stable on current medications   PLAN  Refill Toprol. RTC one year  Corine Shelter PA-C 03/08/2018 3:12 PM

## 2018-03-08 NOTE — Assessment & Plan Note (Signed)
Stable on current medications 

## 2018-05-02 ENCOUNTER — Ambulatory Visit
Admission: RE | Admit: 2018-05-02 | Discharge: 2018-05-02 | Disposition: A | Discharge status: Home / Self Care | Payer: BLUE CROSS/BLUE SHIELD | Source: Ambulatory Visit | Attending: Obstetrics & Gynecology | Admitting: Obstetrics & Gynecology

## 2018-05-02 DIAGNOSIS — Z1231 Encounter for screening mammogram for malignant neoplasm of breast: Secondary | ICD-10-CM

## 2018-11-28 DIAGNOSIS — Q7100 Congenital complete absence of unspecified upper limb: Secondary | ICD-10-CM | POA: Diagnosis not present

## 2019-02-24 ENCOUNTER — Other Ambulatory Visit: Payer: Self-pay | Admitting: Obstetrics & Gynecology

## 2019-02-24 DIAGNOSIS — Z1231 Encounter for screening mammogram for malignant neoplasm of breast: Secondary | ICD-10-CM

## 2019-03-10 ENCOUNTER — Other Ambulatory Visit: Payer: Self-pay | Admitting: Cardiology

## 2019-03-20 DIAGNOSIS — R000 Tachycardia, unspecified: Secondary | ICD-10-CM | POA: Diagnosis not present

## 2019-03-20 DIAGNOSIS — I10 Essential (primary) hypertension: Secondary | ICD-10-CM | POA: Diagnosis not present

## 2019-03-20 DIAGNOSIS — Z87442 Personal history of urinary calculi: Secondary | ICD-10-CM | POA: Diagnosis not present

## 2019-03-20 DIAGNOSIS — Q7121 Congenital absence of both forearm and hand, right upper limb: Secondary | ICD-10-CM | POA: Diagnosis not present

## 2019-04-08 ENCOUNTER — Other Ambulatory Visit: Payer: Self-pay | Admitting: Cardiology

## 2019-04-26 DIAGNOSIS — Q7100 Congenital complete absence of unspecified upper limb: Secondary | ICD-10-CM | POA: Diagnosis not present

## 2019-05-07 ENCOUNTER — Other Ambulatory Visit: Payer: Self-pay | Admitting: Cardiology

## 2019-05-07 NOTE — Telephone Encounter (Signed)
Rx(s) sent to pharmacy electronically.  

## 2019-05-08 ENCOUNTER — Ambulatory Visit
Admission: RE | Admit: 2019-05-08 | Discharge: 2019-05-08 | Disposition: A | Discharge status: Home / Self Care | Payer: BLUE CROSS/BLUE SHIELD | Source: Ambulatory Visit | Attending: Obstetrics & Gynecology | Admitting: Obstetrics & Gynecology

## 2019-05-08 ENCOUNTER — Other Ambulatory Visit: Payer: Self-pay

## 2019-05-08 DIAGNOSIS — Z1231 Encounter for screening mammogram for malignant neoplasm of breast: Secondary | ICD-10-CM

## 2019-06-09 ENCOUNTER — Other Ambulatory Visit: Payer: Self-pay | Admitting: Cardiology

## 2019-07-05 ENCOUNTER — Other Ambulatory Visit: Payer: Self-pay | Admitting: Cardiology

## 2019-07-19 ENCOUNTER — Other Ambulatory Visit: Payer: Self-pay

## 2019-07-19 ENCOUNTER — Emergency Department (HOSPITAL_BASED_OUTPATIENT_CLINIC_OR_DEPARTMENT_OTHER): Payer: BC Managed Care – PPO

## 2019-07-19 ENCOUNTER — Emergency Department (HOSPITAL_BASED_OUTPATIENT_CLINIC_OR_DEPARTMENT_OTHER)
Admission: EM | Admit: 2019-07-19 | Discharge: 2019-07-19 | Disposition: A | Discharge status: Home / Self Care | Payer: BC Managed Care – PPO | Attending: Emergency Medicine | Admitting: Emergency Medicine

## 2019-07-19 ENCOUNTER — Encounter (HOSPITAL_BASED_OUTPATIENT_CLINIC_OR_DEPARTMENT_OTHER): Payer: Self-pay | Admitting: Emergency Medicine

## 2019-07-19 DIAGNOSIS — Z885 Allergy status to narcotic agent status: Secondary | ICD-10-CM | POA: Diagnosis not present

## 2019-07-19 DIAGNOSIS — N132 Hydronephrosis with renal and ureteral calculous obstruction: Secondary | ICD-10-CM | POA: Diagnosis not present

## 2019-07-19 DIAGNOSIS — I10 Essential (primary) hypertension: Secondary | ICD-10-CM | POA: Insufficient documentation

## 2019-07-19 DIAGNOSIS — Z882 Allergy status to sulfonamides status: Secondary | ICD-10-CM | POA: Insufficient documentation

## 2019-07-19 DIAGNOSIS — N201 Calculus of ureter: Secondary | ICD-10-CM

## 2019-07-19 DIAGNOSIS — Z79899 Other long term (current) drug therapy: Secondary | ICD-10-CM | POA: Diagnosis not present

## 2019-07-19 DIAGNOSIS — R1032 Left lower quadrant pain: Secondary | ICD-10-CM | POA: Diagnosis present

## 2019-07-19 LAB — BASIC METABOLIC PANEL
Anion gap: 8 (ref 5–15)
BUN: 14 mg/dL (ref 6–20)
CO2: 24 mmol/L (ref 22–32)
Calcium: 9 mg/dL (ref 8.9–10.3)
Chloride: 106 mmol/L (ref 98–111)
Creatinine, Ser: 0.87 mg/dL (ref 0.44–1.00)
GFR calc Af Amer: >60 mL/min (ref >60)
GFR calc non Af Amer: >60 mL/min (ref >60)
Glucose, Bld: 114 mg/dL — ABNORMAL HIGH (ref 70–99)
Potassium: 3.9 mmol/L (ref 3.5–5.1)
Sodium: 138 mmol/L (ref 135–145)

## 2019-07-19 LAB — URINALYSIS, ROUTINE W REFLEX MICROSCOPIC
Bilirubin Urine: NEGATIVE
Glucose, UA: NEGATIVE mg/dL
Ketones, ur: NEGATIVE mg/dL
Leukocytes,Ua: NEGATIVE
Nitrite: NEGATIVE
Protein, ur: NEGATIVE mg/dL
Specific Gravity, Urine: 1.02 (ref 1.005–1.030)
pH: 6.5 (ref 5.0–8.0)

## 2019-07-19 LAB — URINALYSIS, MICROSCOPIC (REFLEX)

## 2019-07-19 LAB — CBC WITH DIFFERENTIAL/PLATELET
Abs Immature Granulocytes: 0.05 10*3/uL (ref 0.00–0.07)
Basophils Absolute: 0.1 10*3/uL (ref 0.0–0.1)
Basophils Relative: 0 %
Eosinophils Absolute: 0.1 10*3/uL (ref 0.0–0.5)
Eosinophils Relative: 1 %
HCT: 45.9 % (ref 36.0–46.0)
Hemoglobin: 16 g/dL — ABNORMAL HIGH (ref 12.0–15.0)
Immature Granulocytes: 0 %
Lymphocytes Relative: 11 %
Lymphs Abs: 1.5 10*3/uL (ref 0.7–4.0)
MCH: 30.7 pg (ref 26.0–34.0)
MCHC: 34.9 g/dL (ref 30.0–36.0)
MCV: 87.9 fL (ref 80.0–100.0)
Monocytes Absolute: 0.6 10*3/uL (ref 0.1–1.0)
Monocytes Relative: 5 %
Neutro Abs: 11.5 10*3/uL — ABNORMAL HIGH (ref 1.7–7.7)
Neutrophils Relative %: 83 %
Platelets: 311 10*3/uL (ref 150–400)
RBC: 5.22 MIL/uL — ABNORMAL HIGH (ref 3.87–5.11)
RDW: 13 % (ref 11.5–15.5)
WBC: 13.8 10*3/uL — ABNORMAL HIGH (ref 4.0–10.5)
nRBC: 0 % (ref 0.0–0.2)

## 2019-07-19 LAB — PREGNANCY, URINE: Preg Test, Ur: NEGATIVE

## 2019-07-19 MED ORDER — OXYCODONE-ACETAMINOPHEN 5-325 MG PO TABS: 1.0000 | ORAL_TABLET | Freq: Four times a day (QID) | ORAL | 0 refills | Status: DC | PRN

## 2019-07-19 MED ORDER — HYDROMORPHONE HCL 1 MG/ML IJ SOLN
1.0000 mg | Freq: Once | INTRAMUSCULAR | Status: AC
  Administered 2019-07-19: 07:00:00 1 mg via INTRAVENOUS
  Filled 2019-07-19: qty 1

## 2019-07-19 MED ORDER — ONDANSETRON HCL 4 MG/2ML IJ SOLN
4.0000 mg | Freq: Once | INTRAMUSCULAR | Status: AC
  Administered 2019-07-19: 4 mg via INTRAVENOUS
  Filled 2019-07-19: qty 2

## 2019-07-19 MED ORDER — ONDANSETRON 4 MG PO TBDP: 4.0000 mg | ORAL_TABLET | Freq: Three times a day (TID) | ORAL | 0 refills | Status: DC | PRN

## 2019-07-19 MED ORDER — SODIUM CHLORIDE 0.9 % IV SOLN: Freq: Once | INTRAVENOUS | Status: AC

## 2019-07-19 MED ORDER — HYDROMORPHONE HCL 1 MG/ML IJ SOLN
1.0000 mg | Freq: Once | INTRAMUSCULAR | Status: AC
  Administered 2019-07-19: 1 mg via INTRAVENOUS
  Filled 2019-07-19: qty 1

## 2019-07-19 NOTE — ED Triage Notes (Signed)
  Patient comes in with kidney stone like pain that started Thursday.  Patient has extensive hx of kidney stones and surgical interventions.  Patient states pain is on L side and 10/10.  Endorses N/V about 30 mins ago.  Percocet taken at 0330 this morning.

## 2019-07-19 NOTE — ED Provider Notes (Signed)
Pt signed out by Dr. Read Drivers pending CT scan.  This does show a 7 mm stone in the L UVJ with mild to moderate hydronephrosis.  Pt does not have a UTI.  Pain is now controlled.  She has an appt with her urologist on Monday the 15th.  She has flomax at home.  She will be d/c home with percocet and zofran.  Return if worse.   Jacalyn Lefevre, MD 07/19/19 [phone]

## 2019-07-19 NOTE — ED Provider Notes (Signed)
[place] [facility] Provider Note: Georgena Spurling, MD, FACEP  CSN: [id number] MRN: [id number] ARRIVAL: 07/19/19 at [place]: MHOTF/OTF   CHIEF COMPLAINT  Flank Pain   HISTORY OF PRESENT ILLNESS  07/19/19 6:24 AM [name] [name] is a 54 y.o. female with an extensive history of nephrolithiasis and ureterolithiasis dating back to age 73.  She is here with left flank pain that began 2 days ago but became severe this morning about 1:30 AM.  The pain is in the left flank radiating to the left lower quadrant.  She rates it as a 10 out of 10 and characterizes it as sharp and like previous kidney stones.  It is somewhat worse with movement or palpation.  She has had associated nausea and vomiting.  She tried taking Percocet twice earlier this morning but believes she vomited the tablets up both times.  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  She wears a prosthesis.   Past Medical History:  Diagnosis Date  . Congenital absence of right upper extremity    WEARS PROSTHESIS  RIGHT ARM  . History of kidney stones    CYSTINE STONES  . Hypertension   . Multiple thyroid nodules    W/ [facility]--  LAST BX  BENIGN  . PONV (postoperative nausea and vomiting)    PT [place]  . PSVT (paroxysmal supraventricular tachycardia) ([place])    CARDIOLOGIST --  DR CRENSHAW  "NO PROBLEMS IN LONG TIME" WITH FAST HEART RATE  . Renal calculus, right   . Scarlet fever with other complications [id number]    Past Surgical History:  Procedure Laterality Date  . CYSTO/  BILATERAL URETEROSCOPIC LASER LITHOTRIPSY/  BILATERAL URETERAL STONE EXTRACTION/  BILATERAL STENT PLACEMENT  01-22-2008  . CYSTO/  RIGHT RETROGRADE PYELOGRAM/ RIGHT URETEROSCOPY  02-10-2005/   02-17-2005/   03-18-2001   02-17-2005-- STENT PLACEMENT  . CYSTO/  RIGHT URETERAL STENT PLACEMENT  07-10-2000  . CYSTOSCOPY W/ RETROGRADES Right 05/21/2015   Procedure: CYSTOSCOPY WITH RETROGRADE  PYELOGRAM;  Surgeon: Kathie Rhodes, MD;  [facility];  Service: Urology;  Laterality: Right;  . CYSTOSCOPY WITH RETROGRADE PYELOGRAM, URETEROSCOPY AND STENT PLACEMENT Left 09/25/2013   Procedure: LEFT RETROGRADE PYELOGRAM, URETEROSCOPY LASER LITHO AND STENT PLACEMENT;  Surgeon: Claybon Jabs, MD;  Location: [facility];  Service: Urology;  Laterality: Left;  . CYSTOSCOPY WITH RETROGRADE PYELOGRAM, URETEROSCOPY AND STENT PLACEMENT Right 07/16/2015   Procedure: CYSTOSCOPY WITH RETROGRADE PYELOGRAM;  Surgeon: Kathie Rhodes, MD;  Location: [facility];  Service: Urology;  Laterality: Right;  . CYSTOSCOPY WITH URETEROSCOPY, STONE BASKETRY AND STENT PLACEMENT Right 07/16/2015   Procedure: CYSTOSCOPY WITH URETEROSCOPY, STONE BASKETRY;  Surgeon: Kathie Rhodes, MD;  Location: [facility];  Service: Urology;  Laterality: Right;  . CYSTOSCOPY/URETEROSCOPY/HOLMIUM LASER/STENT PLACEMENT Right 05/21/2015   Procedure: RIGHT URETEROSCOPY/HOLMIUM LASER LITHOTRIPSY/RIGHT STENT PLACEMENT;  Surgeon: Kathie Rhodes, MD;  Location: [facility];  Service: Urology;  Laterality: Right;  . CYTSO/  LEFT URETERAL STENT PLACEMENT  04-12-2006  &  06-05-2002  . DILATION AND CURETTAGE OF UTERUS  04-09-2004   W/  SUCTION  . HOLMIUM LASER APPLICATION Left 4/62/7035   Procedure: HOLMIUM LASER APPLICATION;  Surgeon: Claybon Jabs, MD;  Location: [facility];  Service: Urology;  Laterality: Left;  . HOLMIUM LASER APPLICATION N/A 0/01/3817   Procedure: HOLMIUM LASER APPLICATION;  Surgeon: Claybon Jabs, MD;  Location:  [facility];  Service: Urology;  Laterality: N/A;  . NEPHROLITHOTOMY Right 12/15/2013   Procedure: RIGHT PERCUTANEOUS NEPHROLITHOTOMY ;  Surgeon: Garnett Farm, MD;  Location: [facility];  Service: Urology;  Laterality: Right;   [place]  . PERCUTANEOUS NEPHROSTOLITHOTOMY Bilateral LEFT  05-30-2002/   RIGHT 12-13-1999  . RIGHT  URETEROSCOPIC STONE EXTRACTION / STENT PLACEMENT  07-03-2000    Family History  Problem Relation Age of Onset  . Healthy Mother   . Healthy Brother     Social History   Tobacco Use  . Smoking status: Never Smoker  . Smokeless tobacco: Never Used  Substance Use Topics  . Alcohol use: Yes    Comment: rarely  . Drug use: No    Prior to Admission medications   Medication Sig Start Date End Date Taking? Authorizing Provider  metoprolol succinate (TOPROL-XL) 50 MG 24 hr tablet Take 1 tablet (50 mg total) by mouth daily. Please schedule annual appt with Dr. Jens Som for refills. [phone]. 2nd attempt 07/08/19   Lewayne Bunting, MD  ondansetron (ZOFRAN ODT) 4 MG disintegrating tablet Take 1 tablet (4 mg total) by mouth every 8 (eight) hours as needed. 07/19/19   Jacalyn Lefevre, MD  oxyCODONE-acetaminophen (PERCOCET/ROXICET) 5-325 MG tablet Take 1 tablet by mouth every 6 (six) hours as needed for severe pain. 07/19/19   Jacalyn Lefevre, MD  potassium citrate (UROCIT-K) 10 MEQ (1080 MG) SR tablet Take 20 mEq by mouth daily.    [provider]    Allergies Demerol [meperidine hcl], Morphine, and Bactrim [sulfamethoxazole-trimethoprim]   REVIEW OF SYSTEMS  Negative except as noted here or in the History of Present Illness.   PHYSICAL EXAMINATION  Initial Vital Signs Blood pressure (!) 161/72, pulse (!) 59, temperature (!) 97.4 F (36.3 C), temperature source Oral, resp. rate 18, height 5\' 4"  (1.626 m), weight 74.8 kg, SpO2 100 %.  Examination General: Well-developed, well-nourished female in no acute distress; appearance consistent with age of record HENT: normocephalic; atraumatic Eyes: pupils equal, round and reactive to light; extraocular muscles intact Neck: supple Heart: regular rate and rhythm Lungs: clear to auscultation bilaterally Abdomen: soft; nondistended; left lower quadrant tenderness; bowel sounds present GU: Left CVA tenderness Extremities: Left  forearm prosthesis; other pulses normal Neurologic: Awake, alert and oriented; motor function intact in all extremities and symmetric; no facial droop Skin: Warm and dry Psychiatric: Normal mood and affect   RESULTS  Summary of this visit's results, reviewed and interpreted by myself:   EKG Interpretation  Date/Time:    Ventricular Rate:    PR Interval:    QRS Duration:   QT Interval:    QTC Calculation:   R Axis:     Text Interpretation:        Laboratory Studies: Results for orders placed or performed during the hospital encounter of 07/19/19 (from the past 24 hour(s))  Urinalysis, Routine w reflex microscopic     Status: Abnormal   Collection Time: 07/19/19  6:28 AM  Result Value Ref Range   Color, Urine YELLOW YELLOW   APPearance HAZY (A) CLEAR   Specific Gravity, Urine 1.020 1.005 - 1.030   pH 6.5 5.0 - 8.0   Glucose, UA NEGATIVE NEGATIVE mg/dL   Hgb urine dipstick LARGE (A) NEGATIVE   Bilirubin Urine NEGATIVE NEGATIVE   Ketones, ur NEGATIVE NEGATIVE mg/dL   Protein, ur NEGATIVE NEGATIVE mg/dL   Nitrite NEGATIVE NEGATIVE   Leukocytes,Ua NEGATIVE NEGATIVE  Pregnancy, urine     Status: None  Collection Time: 07/19/19  6:28 AM  Result Value Ref Range   Preg Test, Ur NEGATIVE NEGATIVE  Urinalysis, Microscopic (reflex)     Status: Abnormal   Collection Time: 07/19/19  6:28 AM  Result Value Ref Range   RBC / HPF 21-50 0 - 5 RBC/hpf   WBC, UA 0-5 0 - 5 WBC/hpf   Bacteria, UA MANY (A) NONE SEEN   Squamous Epithelial / LPF 0-5 0 - 5  CBC with Differential/Platelet     Status: Abnormal   Collection Time: 07/19/19  6:29 AM  Result Value Ref Range   WBC 13.8 (H) 4.0 - 10.5 K/uL   RBC 5.22 (H) 3.87 - 5.11 MIL/uL   Hemoglobin 16.0 (H) 12.0 - 15.0 g/dL   HCT 37.1 06.2 - 69.4 %   MCV 87.9 80.0 - 100.0 fL   MCH 30.7 26.0 - 34.0 pg   MCHC 34.9 30.0 - 36.0 g/dL   RDW 85.4 62.7 - 03.5 %   Platelets 311 150 - 400 K/uL   nRBC 0.0 0.0 - 0.2 %   Neutrophils Relative % 83 %    Neutro Abs 11.5 (H) 1.7 - 7.7 K/uL   Lymphocytes Relative 11 %   Lymphs Abs 1.5 0.7 - 4.0 K/uL   Monocytes Relative 5 %   Monocytes Absolute 0.6 0.1 - 1.0 K/uL   Eosinophils Relative 1 %   Eosinophils Absolute 0.1 0.0 - 0.5 K/uL   Basophils Relative 0 %   Basophils Absolute 0.1 0.0 - 0.1 K/uL   Immature Granulocytes 0 %   Abs Immature Granulocytes 0.05 0.00 - 0.07 K/uL  Basic metabolic panel     Status: Abnormal   Collection Time: 07/19/19  6:29 AM  Result Value Ref Range   Sodium 138 135 - 145 mmol/L   Potassium 3.9 3.5 - 5.1 mmol/L   Chloride 106 98 - 111 mmol/L   CO2 24 22 - 32 mmol/L   Glucose, Bld 114 (H) 70 - 99 mg/dL   BUN 14 6 - 20 mg/dL   Creatinine, Ser 0.09 0.44 - 1.00 mg/dL   Calcium 9.0 8.9 - 38.1 mg/dL   GFR calc non Af Amer >60 >60 mL/min   GFR calc Af Amer >60 >60 mL/min   Anion gap 8 5 - 15   Imaging Studies: CT Renal Stone Study  Result Date: 07/19/2019 CLINICAL DATA:  Left flank pain, hematuria EXAM: CT ABDOMEN AND PELVIS WITHOUT CONTRAST TECHNIQUE: Multidetector CT imaging of the abdomen and pelvis was performed following the standard protocol without IV contrast. COMPARISON:  CT abdomen dated 05/28/2015. CT abdomen/pelvis dated 07/03/2014. FINDINGS: Lower chest: Lung bases are clear. Hepatobiliary: Unenhanced liver is unremarkable. Gallbladder is unremarkable. No intrahepatic or extrahepatic ductal dilatation. Pancreas: Within normal limits. Spleen: Within normal limits. Adrenals/Urinary Tract: Adrenal glands are within normal limits. Mild right renal cortical lobulation. Two nonobstructing right lower pole renal calculi, including a dominant 3 mm lower pole calculus (series 2/image 40). No hydronephrosis. Three nonobstructing left lower pole renal calculi, including a dominant 17 mm lower pole calculus (coronal image 65). Mild to moderate left hydroureteronephrosis. Associated 7 mm distal left ureteral calculus just above the UVJ (series 2/image 75). Bladder is  underdistended but unremarkable. Stomach/Bowel: Stomach is notable for a small hiatal hernia. No evidence of bowel obstruction. Normal appendix (series 2/image 54). Vascular/Lymphatic: No evidence of abdominal aortic aneurysm. No suspicious abdominopelvic lymphadenopathy. Reproductive: Uterus is within normal limits. Bilateral ovaries are unremarkable. Other: No abdominopelvic ascites. Musculoskeletal: Visualized  osseous structures are within normal limits. IMPRESSION: 7 mm distal left ureteral calculus just above the UVJ. Mild to moderate left hydroureteronephrosis. Additional nonobstructing bilateral renal calculi, measuring up to 17 mm in the left lower pole. Electronically Signed   By: Charline Bills M.D.   On: 07/19/2019 07:22    ED COURSE and MDM  Nursing notes, initial and subsequent vitals signs, including pulse oximetry, reviewed and interpreted by myself.  Vitals:   07/19/19 0621 07/19/19 0818  BP: (!) 161/72 (!) 145/86  Pulse: (!) 59 (!) 53  Resp: 18 16  Temp: (!) 97.4 F (36.3 C)   TempSrc: Oral   SpO2: 100% 99%  Weight: 74.8 kg   Height: 5\' 4"  (1.626 m)    Medications  HYDROmorphone (DILAUDID) injection 1 mg (1 mg Intravenous Given 07/19/19 0641)  ondansetron (ZOFRAN) injection 4 mg (4 mg Intravenous Given 07/19/19 0641)  0.9 %  sodium chloride infusion ( Intravenous Stopped 07/19/19 0818)  HYDROmorphone (DILAUDID) injection 1 mg (1 mg Intravenous Given 07/19/19 0729)   7:00 AM Signed out to Dr. 07/21/19.   PROCEDURES  Procedures   ED DIAGNOSES     ICD-10-CM   1. Ureterolithiasis  N20.1        Nami Strawder, MD 07/19/19 2233

## 2019-07-21 ENCOUNTER — Other Ambulatory Visit: Payer: Self-pay | Admitting: Urology

## 2019-07-21 ENCOUNTER — Ambulatory Visit (HOSPITAL_COMMUNITY): Payer: BC Managed Care – PPO | Admitting: Certified Registered Nurse Anesthetist

## 2019-07-21 ENCOUNTER — Ambulatory Visit (HOSPITAL_COMMUNITY)
Admission: AD | Admit: 2019-07-21 | Discharge: 2019-07-21 | Disposition: A | Discharge status: Home / Self Care | Payer: BC Managed Care – PPO | Source: Other Acute Inpatient Hospital | Attending: Urology | Admitting: Urology

## 2019-07-21 ENCOUNTER — Encounter (HOSPITAL_COMMUNITY)
Admission: AD | Disposition: A | Discharge status: Home / Self Care | Payer: Self-pay | Source: Other Acute Inpatient Hospital | Attending: Urology

## 2019-07-21 ENCOUNTER — Other Ambulatory Visit: Payer: Self-pay

## 2019-07-21 ENCOUNTER — Ambulatory Visit (HOSPITAL_COMMUNITY): Payer: BC Managed Care – PPO

## 2019-07-21 ENCOUNTER — Encounter (HOSPITAL_COMMUNITY): Payer: Self-pay | Admitting: Urology

## 2019-07-21 DIAGNOSIS — Z79891 Long term (current) use of opiate analgesic: Secondary | ICD-10-CM | POA: Diagnosis not present

## 2019-07-21 DIAGNOSIS — Z79899 Other long term (current) drug therapy: Secondary | ICD-10-CM | POA: Diagnosis not present

## 2019-07-21 DIAGNOSIS — N200 Calculus of kidney: Secondary | ICD-10-CM

## 2019-07-21 DIAGNOSIS — K219 Gastro-esophageal reflux disease without esophagitis: Secondary | ICD-10-CM | POA: Insufficient documentation

## 2019-07-21 DIAGNOSIS — I10 Essential (primary) hypertension: Secondary | ICD-10-CM | POA: Diagnosis not present

## 2019-07-21 DIAGNOSIS — U071 COVID-19: Secondary | ICD-10-CM | POA: Insufficient documentation

## 2019-07-21 DIAGNOSIS — N132 Hydronephrosis with renal and ureteral calculous obstruction: Secondary | ICD-10-CM | POA: Insufficient documentation

## 2019-07-21 DIAGNOSIS — N202 Calculus of kidney with calculus of ureter: Secondary | ICD-10-CM | POA: Diagnosis present

## 2019-07-21 DIAGNOSIS — N201 Calculus of ureter: Secondary | ICD-10-CM

## 2019-07-21 HISTORY — PX: CYSTOSCOPY/URETEROSCOPY/HOLMIUM LASER/STENT PLACEMENT: SHX6546

## 2019-07-21 LAB — RESPIRATORY PANEL BY RT PCR (FLU A&B, COVID)
Influenza A by PCR: NEGATIVE
Influenza B by PCR: NEGATIVE
SARS Coronavirus 2 by RT PCR: POSITIVE — AB

## 2019-07-21 SURGERY — CYSTOSCOPY/URETEROSCOPY/HOLMIUM LASER/STENT PLACEMENT
Anesthesia: General | Laterality: Left

## 2019-07-21 MED ORDER — FENTANYL CITRATE (PF) 100 MCG/2ML IJ SOLN
INTRAMUSCULAR | Status: AC
  Filled 2019-07-21: qty 2

## 2019-07-21 MED ORDER — ONDANSETRON HCL 4 MG/2ML IJ SOLN
INTRAMUSCULAR | Status: AC
  Filled 2019-07-21: qty 4

## 2019-07-21 MED ORDER — DEXAMETHASONE SODIUM PHOSPHATE 4 MG/ML IJ SOLN
INTRAMUSCULAR | Status: DC | PRN
  Administered 2019-07-21: 10 mg via INTRAVENOUS

## 2019-07-21 MED ORDER — ONDANSETRON HCL 4 MG/2ML IJ SOLN
INTRAMUSCULAR | Status: DC | PRN
  Administered 2019-07-21: 8 mg via INTRAVENOUS

## 2019-07-21 MED ORDER — SCOPOLAMINE 1 MG/3DAYS TD PT72
MEDICATED_PATCH | TRANSDERMAL | Status: AC
  Filled 2019-07-21: qty 1

## 2019-07-21 MED ORDER — TAMSULOSIN HCL 0.4 MG PO CAPS: 0.4000 mg | ORAL_CAPSULE | Freq: Every day | ORAL | 0 refills | Status: AC

## 2019-07-21 MED ORDER — PROPOFOL 10 MG/ML IV BOLUS
INTRAVENOUS | Status: AC
  Filled 2019-07-21: qty 20

## 2019-07-21 MED ORDER — FENTANYL CITRATE (PF) 100 MCG/2ML IJ SOLN: 25.0000 ug | INTRAMUSCULAR | Status: DC | PRN

## 2019-07-21 MED ORDER — LACTATED RINGERS IV SOLN: INTRAVENOUS | Status: DC

## 2019-07-21 MED ORDER — PROPOFOL 10 MG/ML IV BOLUS
INTRAVENOUS | Status: DC | PRN
  Administered 2019-07-21: 150 mg via INTRAVENOUS

## 2019-07-21 MED ORDER — SCOPOLAMINE 1 MG/3DAYS TD PT72
MEDICATED_PATCH | TRANSDERMAL | Status: DC | PRN
  Administered 2019-07-21: 1 via TRANSDERMAL

## 2019-07-21 MED ORDER — MIDAZOLAM HCL 5 MG/5ML IJ SOLN
INTRAMUSCULAR | Status: DC | PRN
  Administered 2019-07-21: 2 mg via INTRAVENOUS

## 2019-07-21 MED ORDER — MIDAZOLAM HCL 2 MG/2ML IJ SOLN
INTRAMUSCULAR | Status: AC
  Filled 2019-07-21: qty 2

## 2019-07-21 MED ORDER — ACETAMINOPHEN 500 MG PO TABS: 1000.0000 mg | ORAL_TABLET | Freq: Once | ORAL | Status: DC

## 2019-07-21 MED ORDER — CIPROFLOXACIN IN D5W 400 MG/200ML IV SOLN
400.0000 mg | INTRAVENOUS | Status: AC
  Administered 2019-07-21: 400 mg via INTRAVENOUS
  Filled 2019-07-21: qty 200

## 2019-07-21 MED ORDER — LIDOCAINE 2% (20 MG/ML) 5 ML SYRINGE
INTRAMUSCULAR | Status: DC | PRN
  Administered 2019-07-21: 80 mg via INTRAVENOUS

## 2019-07-21 MED ORDER — IOHEXOL 300 MG/ML  SOLN
INTRAMUSCULAR | Status: DC | PRN
  Administered 2019-07-21: 6 mL

## 2019-07-21 MED ORDER — SODIUM CHLORIDE 0.9 % IR SOLN
Status: DC | PRN
  Administered 2019-07-21: 3000 mL

## 2019-07-21 MED ORDER — FENTANYL CITRATE (PF) 100 MCG/2ML IJ SOLN
INTRAMUSCULAR | Status: DC | PRN
  Administered 2019-07-21: 50 ug via INTRAVENOUS
  Administered 2019-07-21: 25 ug via INTRAVENOUS
  Administered 2019-07-21: 50 ug via INTRAVENOUS
  Administered 2019-07-21 (×3): 25 ug via INTRAVENOUS

## 2019-07-21 MED ORDER — CIPROFLOXACIN HCL 500 MG PO TABS: 500.0000 mg | ORAL_TABLET | Freq: Two times a day (BID) | ORAL | 0 refills | Status: AC

## 2019-07-21 SURGICAL SUPPLY — 25 items
BAG URO CATCHER STRL LF (MISCELLANEOUS) ×3 IMPLANT
BASKET STONE NCOMPASS (UROLOGICAL SUPPLIES) IMPLANT
CATH BALN UROMAX ULTRA 18FR 4C (CATHETERS) ×2 IMPLANT
CATH URET 5FR 28IN OPEN ENDED (CATHETERS) IMPLANT
CATH URET DUAL LUMEN 6-10FR 50 (CATHETERS) ×3 IMPLANT
CLOTH BEACON ORANGE TIMEOUT ST (SAFETY) ×3 IMPLANT
EXTRACTOR STONE NITINOL NGAGE (UROLOGICAL SUPPLIES) ×3 IMPLANT
FIBER LASER FLEXIVA 1000 (UROLOGICAL SUPPLIES) ×2 IMPLANT
FIBER LASER FLEXIVA 365 (UROLOGICAL SUPPLIES) IMPLANT
FIBER LASER FLEXIVA 550 (UROLOGICAL SUPPLIES) IMPLANT
FIBER LASER TRAC TIP (UROLOGICAL SUPPLIES) IMPLANT
GLOVE SURG SS PI 8.0 STRL IVOR (GLOVE) IMPLANT
GOWN STRL REUS W/TWL XL LVL3 (GOWN DISPOSABLE) ×3 IMPLANT
GUIDEWIRE STR DUAL SENSOR (WIRE) ×3 IMPLANT
IV NS 1000ML (IV SOLUTION) ×3
IV NS 1000ML BAXH (IV SOLUTION) ×1 IMPLANT
IV NS IRRIG 3000ML ARTHROMATIC (IV SOLUTION) ×3 IMPLANT
KIT TURNOVER KIT A (KITS) IMPLANT
LASER FIBER SIDE FIRE SU 550UM (MISCELLANEOUS) ×2 IMPLANT
MANIFOLD NEPTUNE II (INSTRUMENTS) ×3 IMPLANT
PACK CYSTO (CUSTOM PROCEDURE TRAY) ×3 IMPLANT
SHEATH URETERAL 12FRX35CM (MISCELLANEOUS) ×3 IMPLANT
TUBING CONNECTING 10 (TUBING) ×2 IMPLANT
TUBING CONNECTING 10' (TUBING) ×1
TUBING UROLOGY SET (TUBING) ×3 IMPLANT

## 2019-07-21 NOTE — Transfer of Care (Signed)
Immediate Anesthesia Transfer of Care Note  Patient: [name] [name]  Procedure(s) Performed: CYSTOSCOPY/URETEROSCOPY/HOLMIUM LASER/STENT PLACEMENT (Left )  Patient Location: PACU  Anesthesia Type:General  Level of Consciousness: sedated  Airway & Oxygen Therapy: Patient Spontanous Breathing and Patient connected to face mask oxygen  Post-op Assessment: Report given to RN and Post -op Vital signs reviewed and stable  Post vital signs: Reviewed and stable  Last Vitals:  Vitals Value Taken Time  BP 129/76 07/21/19 1850  Temp    Pulse 72 07/21/19 1851  Resp 15 07/21/19 1851  SpO2 100 % 07/21/19 1851  Vitals shown include unvalidated device data.  Last Pain:  Vitals:   07/21/19 1439  TempSrc: Oral  PainSc: 0-No pain         Complications: No apparent anesthesia complications

## 2019-07-21 NOTE — Anesthesia Procedure Notes (Addendum)
Date/Time: 07/21/2019 6:44 PM Performed by: Minerva Ends, CRNA Oxygen Delivery Method: Simple face mask Placement Confirmation: positive ETCO2 and breath sounds checked- equal and bilateral Dental Injury: Teeth and Oropharynx as per pre-operative assessment

## 2019-07-21 NOTE — Anesthesia Preprocedure Evaluation (Addendum)
Anesthesia Evaluation  Patient identified by MRN, date of birth, ID band Patient awake    Reviewed: Allergy & Precautions, NPO status , Patient's Chart, lab work & pertinent test results, reviewed documented beta blocker date and time   History of Anesthesia Complications (+) PONV  Airway Mallampati: I  TM Distance: >3 FB Neck ROM: Full    Dental no notable dental hx. (+) Teeth Intact, Dental Advisory Given   Pulmonary neg pulmonary ROS,  Covid positive today and also in November 2020, has received both vaccinations   Pulmonary exam normal breath sounds clear to auscultation       Cardiovascular hypertension, Pt. on home beta blockers and Pt. on medications Normal cardiovascular exam+ dysrhythmias Supra Ventricular Tachycardia  Rhythm:Regular Rate:Normal  TTE 2018 Normal LVEF, valves ok   Neuro/Psych negative neurological ROS  negative psych ROS   GI/Hepatic Neg liver ROS, GERD  Medicated,  Endo/Other  negative endocrine ROS  Renal/GU negative Renal ROS  negative genitourinary   Musculoskeletal negative musculoskeletal ROS (+)   Abdominal   Peds  Hematology negative hematology ROS (+)   Anesthesia Other Findings Left ureteral stone  Reproductive/Obstetrics                            Anesthesia Physical Anesthesia Plan  ASA: III  Anesthesia Plan: General   Post-op Pain Management:    Induction: Intravenous  PONV Risk Score and Plan: 4 or greater and Ondansetron, Dexamethasone, Midazolam and Scopolamine patch - Pre-op  Airway Management Planned: LMA  Additional Equipment:   Intra-op Plan:   Post-operative Plan: Extubation in OR  Informed Consent: I have reviewed the patients History and Physical, chart, labs and discussed the procedure including the risks, benefits and alternatives for the proposed anesthesia with the patient or authorized representative who has indicated  his/her understanding and acceptance.     Dental advisory given  Plan Discussed with: CRNA  Anesthesia Plan Comments:         Anesthesia Quick Evaluation

## 2019-07-21 NOTE — H&P (Signed)
I have a history of kidney stones.  HPI: [name] [name] is a 54 year-old female established patient who is here for F/U due to a history of renal calculi.  The patient was last seen 03/01/2017. The patient's stone was on her left side. She did not pass a stone since the last office visit. The patient has been taking Potassium citrate for their calculus disease.   The patient has not had any flank pain since they were last seen. The patient denies any progressive voiding symptoms. She has no seen blood in her urine since the last visit. She is currently having flank pain, back pain, and nausea. She denies having groin pain, vomiting, fever, and chills. She has had Ureteroscopy and Percutaneous Nephrolithotomy for treatment of her stones in the past.   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 She was on Thiola in the past and tolerated this. She's had multiple surgical procedures for her stones dating back to 2/95 when she underwent bilateral lithotripsy.  In 1/04 she had a left percutaneous nephrostolithotomy.  10/06 right ureteroscopy with the finding of multiple Randles plaques but no stone that could be extracted. She ended up having to have a stent placed later that same month for ureteral obstruction.  12/07 she underwent another left percutaneous nephrostolithotomy.  9/15 right PCNL for 2.5 cm stone.  1/17 right ureteroscopy and laser lithotripsy.  Previous pharmacologic treatments: She tolerated Thiola in the distant past. She was placed on captopril for sometime but this was not effective. Thiola therapy was reinitiated but caused palpitations and had to be discontinued.  Current therapy: KCit 88mEq. b.i.d.    Interval history 08/18/16: She has been doing well overall primarily with increased fluid intake to help prevent stone formation. She said she had a little pain just the other day but did not see any stone pass and had no hematuria. She is asymptomatic at this time.    07/21/19: The patient has a history of cystinuria. She developed excruciating left flank pain and ended up being seen in the emergency room 2 days ago. A CT scan was obtained that revealed a punctate stone in the right kidney. Stones were also located in the left kidney and hydronephrosis due to a distal left ureteral stone measuring 4.8 mm in width (the radiologist reported the stone was 7 mm although that is the length). She has had continued nausea controlled with Zofran to some extent. She has required continuous code own since her ER visit and remains uncomfortable. No fever or chills.     ALLERGIES: Bactrim TABS Demerol TABS Morphine Derivatives    MEDICATIONS: Hydromorphone Hcl 4 mg tablet 1 tablet PO Q 4 H  Metoprolol Succinate 50 mg tablet, extended release 24 hr  Percocet 5 mg-325 mg tablet  Potassium Citrate Er 15 meq (1,620 mg) tablet, extended release  Zofran 4 mg tablet     GU PSH: Cysto Uretero Lithotripsy - 2015, 2009 Cystoscopy Insert Stent - 2015, 2009, 2009 Cystoscopy Ureteroscopy - 2009 ESWL - 2009 PCNL - 2009, 2009 Percut Stone Removal >2cm - 2015 Ureteroscopic laser litho - 2017 Ureteroscopic stone removal - 2017, 2009       [place] Notes: Cystoscopy With Ureteroscopy With Removal Of Calculus, Cystoscopy Ureteroscopy Lithotripsy Incl Insert Indwelling Ureter Stent, Percutaneous Lithotomy For Stone Over 2cm., Cystoscopy With Ureteroscopy With Lithotripsy, Cystoscopy With Insertion Of Ureteral Stent Left, Cystoscopy With Pyeloscopy With Removal Of Calculus, Cystoscopy With Pyeloscopy With Lithotripsy, Cystoscopy With Insertion Of Ureteral  Stent Bilateral, Lithotripsy, Cystoscopy With Insertion Of Ureteral Stent Right, Percutaneous Lithotomy, Percutaneous Lithotomy, Cystoscopy With Ureteroscopy Right   NON-GU PSH: No Non-GU PSH    GU PMH: Renal calculus (Stable), She has bilateral renal calculi. They do not appear to have progressed. Her urine pH was 8.5 she is on  potassium citrate. We again discussed possibly using Thiola at a reduced dose but she is reluctant to do so because of the palpitations that it caused her in the past and the fact that she's had recent palpitations so I will continue to follow her with renal ultrasounds. - 03/01/2017, (Stable), Her bilateral renal calculi appear stable. She is not developing significant new stones at this time. We discussed continuing hydration and potassium citrate. I also discussed with her considering reinitiating treatment with Thiola reduced dosage since she developed palpitations when she was on it at a full dose. Her urine pH was 7.5 today which is good. She's going to contact the pharmacist regarding a reduced dosage of Thiola and I will then plan to see her back again in 6 months for repeat renal ultrasound., - 2018, Renal calculus, bilateral, - 2017 Acute Cystitis/UTI, She did have symptoms of acute cystitis and took a short course of Cipro but was unable to tolerate the medication so I'm going to empirically begin nitrofurantoin and culture her urine. She tends to be prone to yeast infection so I am going to give her some fluconazole as well to have on hand. - 2017 Renal cyst, Renal cyst, right - 2017    NON-GU PMH: Other disorders of amino-acid transport, Cystine stones - 2017 Cystinuria (Chronic), Cystinuria - 2016 Encounter for general adult medical examination without abnormal findings, Encounter for preventive health examination - 2016 Cardiac murmur, unspecified, Murmurs - 2014 Personal history of other diseases of the circulatory system, History of hypertension - 2014    FAMILY HISTORY: 1 son - Son Death In The Family Father - Father Urologic Disorder - Runs In Family   SOCIAL HISTORY: Marital Status: Married Preferred Language: English; Ethnicity: Not Hispanic Or Latino; Race: White Current Smoking Status: Patient has never smoked.  Has never drank.  Drinks 3 caffeinated drinks per day.      Notes: Never a smoker, Caffeine Use, Alcohol Use, Death In The Family Father, Occupation:, Tobacco Use, Marital History - Currently Married   REVIEW OF SYSTEMS:    GU Review Female:   Patient denies frequent urination, hard to postpone urination, burning /pain with urination, get up at night to urinate, leakage of urine, stream starts and stops, trouble starting your stream, have to strain to urinate, and being pregnant.  Gastrointestinal (Upper):   Patient denies nausea, vomiting, and indigestion/ heartburn.  Gastrointestinal (Lower):   Patient denies diarrhea and constipation.  Constitutional:   Patient denies fever, night sweats, weight loss, and fatigue.  Skin:   Patient denies skin rash/ lesion and itching.  Eyes:   Patient denies blurred vision and double vision.  Ears/ Nose/ Throat:   Patient denies sore throat and sinus problems.  Hematologic/Lymphatic:   Patient denies swollen glands and easy bruising.  Cardiovascular:   Patient denies leg swelling and chest pains.  Respiratory:   Patient denies cough and shortness of breath.  Endocrine:   Patient denies excessive thirst.  Musculoskeletal:   Patient denies back pain and joint pain.  Neurological:   Patient denies headaches and dizziness.  Psychologic:   Patient denies depression and anxiety.   VITAL SIGNS:  07/21/2019 12:36 PM  Weight 165 lb / 74.84 kg  Height 64 in / 162.56 cm  BP 123/78 mmHg  Pulse 68 /min  Temperature 97.1 F / 36.1 C  BMI 28.3 kg/m   MULTI-SYSTEM PHYSICAL EXAMINATION:    Constitutional: Well-nourished. No physical deformities. Normally developed. Good grooming.  Neck: Neck symmetrical, not swollen. Normal tracheal position.  Respiratory: No labored breathing, no use of accessory muscles.   Cardiovascular: Normal temperature, normal extremity pulses, no swelling, no varicosities.  Lymphatic: No enlargement of neck, axillae, groin.  Skin: No paleness, no jaundice, no cyanosis. No lesion, no ulcer, no  rash.  Neurologic / Psychiatric: Oriented to time, oriented to place, oriented to person. No depression, no anxiety, no agitation.  Gastrointestinal: No mass, no tenderness, no rigidity, non obese abdomen.  Eyes: Normal conjunctivae. Normal eyelids.  Ears, Nose, Mouth, and Throat: Left ear no scars, no lesions, no masses. Right ear no scars, no lesions, no masses. Nose no scars, no lesions, no masses. Normal hearing. Normal lips.  Musculoskeletal: Normal gait and station of head and neck. She has a right arm prosthesis.     PAST DATA REVIEWED:  Source Of History:  Patient  Lab Test Review:   BUN/Creatinine  Records Review:   Previous Patient Records, POC Tool  Notes:                     Her creatinine in 9/18 was 0.72.   PROCEDURES:          Urinalysis w/Scope Dipstick Dipstick Cont'd Micro  Color: Yellow Bilirubin: Neg mg/dL WBC/hpf: 0 - 5/hpf  Appearance: Cloudy Ketones: Trace mg/dL RBC/hpf: 0 - 2/hpf  Specific Gravity: >1.030 Blood: Trace ery/uL Bacteria: Many (>50/hpf)  pH: 5.5 Protein: Trace mg/dL Cystals: NS (Not Seen)  Glucose: Neg mg/dL Urobilinogen: 0.2 mg/dL Casts: NS (Not Seen)    Nitrites: Neg Trichomonas: Not Present    Leukocyte Esterase: Neg leu/uL Mucous: Not Present      Epithelial Cells: 6 - 10/hpf      Yeast: NS (Not Seen)      Sperm: Not Present    ASSESSMENT:      ICD-10 Details  1 GU:   Renal calculus - N20.0 Bilateral, Chronic, Stable  2   Ureteral calculus - N20.1 Acute, Systemic Symptoms          Notes:   She has a small stone in her right kidney and several stones in the lower pole left kidney that are nonobstructing. She does have a 5 mm distal left ureteral stone that is causing hydronephrosis and other systemic symptoms including nausea. She has remained symptomatic for the last 2 days. Her stones are cystine and we therefore discussed proceeding with ureteroscopic management today. I discussed this with Dr. Annabell Howells who has agreed to perform her procedure  today.  She has been prescribed potassium citrate but indicates that she has not been compliant with this medication. She can't tolerate Thiola. We did discuss the need for continued use of potassium citrate to help diminish further stone formation.    PLAN:           Document Letter(s):  Created for Patient: Clinical Summary         Next Appointment:      Next Appointment: 07/29/2019 02:30 PM    Appointment Type: Postoperative Appointment    Location: [facility] [phone]    Provider: Vianne Bulls, NP    Reason for Visit: post  op

## 2019-07-21 NOTE — Anesthesia Procedure Notes (Signed)
Procedure Name: LMA Insertion Date/Time: 07/21/2019 5:33 PM Performed by: Vanessa [place], CRNA Pre-anesthesia Checklist: Emergency Drugs available, Patient identified, Suction available and Patient being monitored Patient Re-evaluated:Patient Re-evaluated prior to induction Oxygen Delivery Method: Circle system utilized Preoxygenation: Pre-oxygenation with 100% oxygen Induction Type: IV induction Ventilation: Mask ventilation without difficulty LMA: LMA with gastric port inserted LMA Size: 4.0 Number of attempts: 1 Placement Confirmation: positive ETCO2 and breath sounds checked- equal and bilateral Tube secured with: Tape Dental Injury: Teeth and Oropharynx as per pre-operative assessment

## 2019-07-21 NOTE — Op Note (Addendum)
Preoperative Diagnosis: Left distal ureteral calculus  Postoperative Diagnosis:  Same  Procedure(s) Performed:   1. Cystourethroscopy 2. Left ureteroscopic stone extraction with laser lithotripsy 3. Left retrograde pyelogram  4. Left ureteral stent placement, 6Fr x 24cm JJ ureteral stent with dangler 5. Intraoperative fluoroscopy with interpretation <1hr.   Teaching Surgeon:  Bjorn Pippin, MD  Resident Surgeon:  Elio Forget, MD  Assistant(s):  None  Anesthesia:  General via LMA   Fluids:  See anesthesia record  Estimated blood loss:  Minimal  Specimens:  None Cultures:  None  Drains:  LEFT 6Fr x 24cm JJ ureteral stent with dangler  Complications:  None  Indications: 54 y.o. patient with a history of nephrolithiasis presenting for ureteroscopic stone extraction of left distal ureteral stone. Risks & benefits of the procedure discussed with the patient, who wishes to proceed.  Findings:   Cystourethroscopy demonstrated fine cystine crystals in bladder; urethra and bladder otherwise normal.  Left distal ureteral stone, fragmented and removed. Successful left ureteral stent placement.   Radiologic Interpretation of Retrograde Pyelogram: Left retrograde pyelogram demonstrated filling defect in distal ureter with moderate hydroureteronephrosis.   Description:  The patient was correctly identified in the preop holding area where written informed consent as well potential risk and complication reviewed. The patient agreed. They were brought back to the operative suite where a preinduction timeout was performed. Once correct information was verified, general anesthesia was induced via LMA. They were then gently placed into dorsal lithotomy position with SCDs in place for VTE prophylaxis. They were prepped and draped in the usual sterile fashion and given appropriate preoperative antibiotics. A second timeout was then performed.   We inserted a 38F rigid cystoscope per urethra with  copious lubrication and normal saline irrigation running. This demonstrated findings as described above.    We cannulated the left ureteral orifice with an open ended catheter and performed left retrograde pyelogram with the findings noted above. A sensor wire was advanced into the expected location of the left renal pelvis without difficulty. The left ureteral orifice was dilated with a balloon dilator. We then removed the cystoscope leaving our sensor wire in place.  We then advanced a semirigid ureteroscope and passed this along side the sensor wire and performed distal ureteroscopy which showed distal left ureteral stone.   Wethen obtained a 200 micron holmium laser fiber and performed laser lithotripsy until all of the stone burden was fragmented into several small fragments which were basket extracted using an Engage basket without complication. Stone fragments were not sent for chemical analysis given known history of cystine stones. We then re-explored the location of stone treatment which demonstrated no residual stone burden. At this point we elected to leave a ureteral stent and withdrew our instruments leaving a sensor wire in place. We then advanced a 6Fr x 24cm JJ ureteral stent with dangler with the assistance of a stent pusher under fluoroscopic without difficulty. Sensor wire removal demonstrated satisfactory stent curl proximally in the renal pelvis and distally in the bladder. The bladder was emptied and all instrumentation was removed. The stent string was tucked in vagina. The patient was woken up from anesthesia and taken to the recovery unit for routine postoperative care.  Post Op Plan:   1. Discharge home when meets PACU criteria. 2. Remove stent at home in four days; continue empiric antibiotics until stent removed.

## 2019-07-21 NOTE — Progress Notes (Addendum)
Called patient's husband and informed him that patient has just left to go to surgery. Dr Annabell Howells will be next person to call him.

## 2019-07-23 NOTE — Anesthesia Postprocedure Evaluation (Signed)
Anesthesia Post Note  Patient: [name] [name]  Procedure(s) Performed: CYSTOSCOPY/URETEROSCOPY/HOLMIUM LASER/STENT PLACEMENT (Left )     Patient location during evaluation: PACU Anesthesia Type: General Level of consciousness: awake and alert Pain management: pain level controlled Vital Signs Assessment: post-procedure vital signs reviewed and stable Respiratory status: spontaneous breathing, nonlabored ventilation, respiratory function stable and patient connected to nasal cannula oxygen Cardiovascular status: blood pressure returned to baseline and stable Postop Assessment: no apparent nausea or vomiting Anesthetic complications: no    Last Vitals:  Vitals:   07/21/19 1930 07/21/19 1955  BP: 135/74 130/71  Pulse: 69 63  Resp: 12   Temp:  36.9 C  SpO2: 97% 98%    Last Pain:  Vitals:   07/22/19 0847  TempSrc:   PainSc: 2    Pain Goal:                   Xabi Wittler L Krishawna Stiefel

## 2019-08-06 ENCOUNTER — Other Ambulatory Visit: Payer: Self-pay | Admitting: Cardiology

## 2019-09-06 ENCOUNTER — Other Ambulatory Visit: Payer: Self-pay | Admitting: Cardiology

## 2019-10-08 ENCOUNTER — Other Ambulatory Visit: Payer: Self-pay | Admitting: Cardiology

## 2019-10-09 ENCOUNTER — Other Ambulatory Visit: Payer: Self-pay | Admitting: Cardiology

## 2019-10-09 MED ORDER — METOPROLOL SUCCINATE ER 50 MG PO TB24: 50.0000 mg | ORAL_TABLET | Freq: Every day | ORAL | 0 refills | Status: DC

## 2019-10-09 NOTE — Telephone Encounter (Signed)
*  STAT* If patient is at the pharmacy, call can be transferred to refill team.   1. Which medications need to be refilled? (please list name of each medication and dose if known) metoprolol succinate (TOPROL-XL) 50 MG 24 hr tablet  2. Which pharmacy/location (including street and city if local pharmacy) is medication to be sent to? [facility] - [place], [place] - [address]  3. Do they need a 30 day or 90 day supply? 90   Patient scheduled with Dr. Jens Som on 01/08/20

## 2019-10-31 ENCOUNTER — Other Ambulatory Visit: Payer: Self-pay | Admitting: Urology

## 2019-11-03 NOTE — Patient Instructions (Addendum)
DUE TO COVID-19 ONLY ONE VISITOR IS ALLOWED TO COME WITH YOU AND STAY IN THE WAITING ROOM ONLY DURING PRE OP AND PROCEDURE DAY OF SURGERY. THE 1 VISITOR MAY VISIT WITH YOU AFTER SURGERY IN YOUR PRIVATE ROOM DURING VISITING HOURS ONLY!  YOU NEED TO HAVE A COVID 19 TEST ON 11-04-19 @ 2PM_, THIS TEST MUST BE DONE BEFORE SURGERY, COME  [address], [place] , [address].  [facility]) ONCE YOUR COVID TEST IS COMPLETED, PLEASE BEGIN THE QUARANTINE INSTRUCTIONS AS OUTLINED IN YOUR HANDOUT.                [name] [name]  11/03/2019   Your procedure is scheduled on: 11-07-19   Report to [facility] Main  Entrance    Report to admitting at 8:30 AM     Call this number if you have problems the morning of surgery [phone]    Remember: Do not eat food or drink liquids :After Midnight.     Take these medicines the morning of surgery with A SIP OF WATER: Omeprazole (Prilosec)  BRUSH YOUR TEETH MORNING OF SURGERY AND RINSE YOUR MOUTH OUT, NO CHEWING GUM CANDY OR MINTS.                                 You may not have any metal on your body including hair pins and              piercings    DO not wear jewelry, make-up, lotions, powders or perfumes, deodorant              Do not wear nail polish on your fingernails.  Do not shave  48 hours prior to surgery.               Do not bring valuables to the hospital. [facility] IS NOT             RESPONSIBLE   FOR VALUABLES.  Contacts, dentures or bridgework may not be worn into surgery.      Patients discharged the day of surgery will not be allowed to drive home. IF YOU ARE HAVING SURGERY AND GOING HOME THE SAME DAY, YOU MUST HAVE AN ADULT TO DRIVE YOU HOME AND BE WITH YOU FOR 24 HOURS. YOU MAY GO HOME BY TAXI OR UBER OR ORTHERWISE, BUT AN ADULT MUST ACCOMPANY YOU HOME AND STAY WITH YOU FOR 24 HOURS.  Name and phone number of your driver: Mishika Flippen [phone]  Special Instructions: N/A              Please  read over the following fact sheets you were given: _____________________________________________________________________             [facility] - Preparing for Surgery Before surgery, you can play an important role.  Because skin is not sterile, your skin needs to be as free of germs as possible.  You can reduce the number of germs on your skin by washing with CHG (chlorahexidine gluconate) soap before surgery.  CHG is an antiseptic cleaner which kills germs and bonds with the skin to continue killing germs even after washing. Please DO NOT use if you have an allergy to CHG or antibacterial soaps.  If your skin becomes reddened/irritated stop using the CHG and inform your nurse when you arrive at Short Stay. Do not shave (including legs and underarms) for at least 48  hours prior to the first CHG shower.  You may shave your face/neck. Please follow these instructions carefully:  1.  Shower with CHG Soap the night before surgery and the  morning of Surgery.  2.  If you choose to wash your hair, wash your hair first as usual with your  normal  shampoo.  3.  After you shampoo, rinse your hair and body thoroughly to remove the  shampoo.                           4.  Use CHG as you would any other liquid soap.  You can apply chg directly  to the skin and wash                       Gently with a scrungie or clean washcloth.  5.  Apply the CHG Soap to your body ONLY FROM THE NECK DOWN.   Do not use on face/ open                           Wound or open sores. Avoid contact with eyes, ears mouth and genitals (private parts).                       Wash face,  Genitals (private parts) with your normal soap.             6.  Wash thoroughly, paying special attention to the area where your surgery  will be performed.  7.  Thoroughly rinse your body with warm water from the neck down.  8.  DO NOT shower/wash with your normal soap after using and rinsing off  the CHG Soap.                9.  Pat yourself dry  with a clean towel.            10.  Wear clean pajamas.            11.  Place clean sheets on your bed the night of your first shower and do not  sleep with pets. Day of Surgery : Do not apply any lotions/deodorants the morning of surgery.  Please wear clean clothes to the hospital/surgery center.  FAILURE TO FOLLOW THESE INSTRUCTIONS MAY RESULT IN THE CANCELLATION OF YOUR SURGERY PATIENT SIGNATURE_________________________________  NURSE SIGNATURE__________________________________  ________________________________________________________________________

## 2019-11-03 NOTE — Progress Notes (Signed)
Please place surgery orders. Pt scheduled for her PAT tomorrow.

## 2019-11-03 NOTE — Progress Notes (Addendum)
PCP - Gilman Schmidt, NP  Cardiologist - Dr. Jens Som LOV 03-08-18 f/u appt 9-21  No stimulator in back  PPM/ICD -  Device Orders -  Rep Notified -   Chest x-ray -  EKG -  Stress Test -  ECHO -  Cardiac Cath -   Sleep Study -  CPAP -   Fasting Blood Sugar -  Checks Blood Sugar _____ times a day  Blood Thinner Instructions: Aspirin Instructions:  ERAS Protcol - PRE-SURGERY Ensure or G2-   COVID TEST-   COVID Vaccination x 2 Moderna  Can complete ADL's w/o being short of breath  Anesthesia review:   Patient denies shortness of breath, fever, cough and chest pain at PAT appointment   All instructions explained to the patient, with a verbal understanding of the material. Patient agrees to go over the instructions while at home for a better understanding. Patient also instructed to self quarantine after being tested for COVID-19. The opportunity to ask questions was provided.

## 2019-11-04 ENCOUNTER — Other Ambulatory Visit: Payer: Self-pay

## 2019-11-04 ENCOUNTER — Encounter (HOSPITAL_COMMUNITY): Payer: Self-pay

## 2019-11-04 ENCOUNTER — Encounter (HOSPITAL_COMMUNITY)
Admission: RE | Admit: 2019-11-04 | Discharge: 2019-11-04 | Disposition: A | Discharge status: Home / Self Care | Payer: BC Managed Care – PPO | Source: Ambulatory Visit | Attending: Urology | Admitting: Urology

## 2019-11-04 ENCOUNTER — Other Ambulatory Visit (HOSPITAL_COMMUNITY)
Admission: RE | Admit: 2019-11-04 | Discharge: 2019-11-04 | Disposition: A | Discharge status: Home / Self Care | Payer: BC Managed Care – PPO | Source: Ambulatory Visit | Attending: Urology | Admitting: Urology

## 2019-11-04 DIAGNOSIS — Z20822 Contact with and (suspected) exposure to covid-19: Secondary | ICD-10-CM | POA: Diagnosis not present

## 2019-11-04 DIAGNOSIS — I10 Essential (primary) hypertension: Secondary | ICD-10-CM | POA: Diagnosis not present

## 2019-11-04 DIAGNOSIS — Z01818 Encounter for other preprocedural examination: Secondary | ICD-10-CM | POA: Diagnosis present

## 2019-11-04 LAB — CBC
HCT: 43.3 % (ref 36.0–46.0)
Hemoglobin: 14.6 g/dL (ref 12.0–15.0)
MCH: 30 pg (ref 26.0–34.0)
MCHC: 33.7 g/dL (ref 30.0–36.0)
MCV: 89.1 fL (ref 80.0–100.0)
Platelets: 280 10*3/uL (ref 150–400)
RBC: 4.86 MIL/uL (ref 3.87–5.11)
RDW: 12.6 % (ref 11.5–15.5)
WBC: 7.4 10*3/uL (ref 4.0–10.5)
nRBC: 0 % (ref 0.0–0.2)

## 2019-11-04 LAB — BASIC METABOLIC PANEL
Anion gap: 7 (ref 5–15)
BUN: 19 mg/dL (ref 6–20)
CO2: 29 mmol/L (ref 22–32)
Calcium: 8.9 mg/dL (ref 8.9–10.3)
Chloride: 103 mmol/L (ref 98–111)
Creatinine, Ser: 1.25 mg/dL — ABNORMAL HIGH (ref 0.44–1.00)
GFR calc Af Amer: 57 mL/min — ABNORMAL LOW (ref >60)
GFR calc non Af Amer: 49 mL/min — ABNORMAL LOW (ref >60)
Glucose, Bld: 93 mg/dL (ref 70–99)
Potassium: 4.4 mmol/L (ref 3.5–5.1)
Sodium: 139 mmol/L (ref 135–145)

## 2019-11-04 LAB — SARS CORONAVIRUS 2 (TAT 6-24 HRS): SARS Coronavirus 2: NEGATIVE

## 2019-11-06 MED ORDER — GENTAMICIN SULFATE 40 MG/ML IJ SOLN
5.0000 mg/kg | INTRAVENOUS | Status: AC
  Administered 2019-11-07: 310 mg via INTRAVENOUS
  Filled 2019-11-06 (×2): qty 7.75

## 2019-11-07 ENCOUNTER — Ambulatory Visit (HOSPITAL_COMMUNITY)
Admission: RE | Admit: 2019-11-07 | Discharge: 2019-11-07 | Disposition: A | Discharge status: Home / Self Care | Payer: BC Managed Care – PPO | Attending: Urology | Admitting: Urology

## 2019-11-07 ENCOUNTER — Ambulatory Visit (HOSPITAL_COMMUNITY): Payer: BC Managed Care – PPO | Admitting: Anesthesiology

## 2019-11-07 ENCOUNTER — Encounter (HOSPITAL_COMMUNITY): Payer: Self-pay | Admitting: Urology

## 2019-11-07 ENCOUNTER — Ambulatory Visit (HOSPITAL_COMMUNITY): Payer: BC Managed Care – PPO

## 2019-11-07 ENCOUNTER — Encounter (HOSPITAL_COMMUNITY)
Admission: RE | Disposition: A | Discharge status: Home / Self Care | Payer: Self-pay | Source: Home / Self Care | Attending: Urology

## 2019-11-07 DIAGNOSIS — Q7101 Congenital complete absence of right upper limb: Secondary | ICD-10-CM | POA: Diagnosis not present

## 2019-11-07 DIAGNOSIS — Z79899 Other long term (current) drug therapy: Secondary | ICD-10-CM | POA: Diagnosis not present

## 2019-11-07 DIAGNOSIS — I10 Essential (primary) hypertension: Secondary | ICD-10-CM | POA: Insufficient documentation

## 2019-11-07 DIAGNOSIS — E7201 Cystinuria: Secondary | ICD-10-CM | POA: Insufficient documentation

## 2019-11-07 DIAGNOSIS — N201 Calculus of ureter: Secondary | ICD-10-CM | POA: Insufficient documentation

## 2019-11-07 HISTORY — PX: CYSTOSCOPY WITH RETROGRADE PYELOGRAM, URETEROSCOPY AND STENT PLACEMENT: SHX5789

## 2019-11-07 HISTORY — PX: HOLMIUM LASER APPLICATION: SHX5852

## 2019-11-07 SURGERY — CYSTOURETEROSCOPY, WITH RETROGRADE PYELOGRAM AND STENT INSERTION
Anesthesia: General | Laterality: Left

## 2019-11-07 MED ORDER — OXYCODONE-ACETAMINOPHEN 5-325 MG PO TABS: 1.0000 | ORAL_TABLET | Freq: Four times a day (QID) | ORAL | 0 refills | Status: DC | PRN

## 2019-11-07 MED ORDER — FENTANYL CITRATE (PF) 100 MCG/2ML IJ SOLN
INTRAMUSCULAR | Status: AC
  Filled 2019-11-07: qty 2

## 2019-11-07 MED ORDER — PROPOFOL 10 MG/ML IV BOLUS
INTRAVENOUS | Status: DC | PRN
  Administered 2019-11-07: 140 mg via INTRAVENOUS

## 2019-11-07 MED ORDER — ORAL CARE MOUTH RINSE: 15.0000 mL | Freq: Once | OROMUCOSAL | Status: AC

## 2019-11-07 MED ORDER — PROPOFOL 500 MG/50ML IV EMUL
INTRAVENOUS | Status: DC | PRN
Start: 2019-11-07 — End: 2019-11-07
  Administered 2019-11-07: 200 ug/kg/min via INTRAVENOUS

## 2019-11-07 MED ORDER — FENTANYL CITRATE (PF) 100 MCG/2ML IJ SOLN
INTRAMUSCULAR | Status: DC | PRN
  Administered 2019-11-07: 25 ug via INTRAVENOUS
  Administered 2019-11-07: 50 ug via INTRAVENOUS
  Administered 2019-11-07: 25 ug via INTRAVENOUS

## 2019-11-07 MED ORDER — ONDANSETRON HCL 4 MG/2ML IJ SOLN
INTRAMUSCULAR | Status: AC
  Filled 2019-11-07: qty 2

## 2019-11-07 MED ORDER — DEXAMETHASONE SODIUM PHOSPHATE 10 MG/ML IJ SOLN
INTRAMUSCULAR | Status: DC | PRN
Start: 2019-11-07 — End: 2019-11-07
  Administered 2019-11-07: 10 mg via INTRAVENOUS

## 2019-11-07 MED ORDER — FENTANYL CITRATE (PF) 100 MCG/2ML IJ SOLN
25.0000 ug | INTRAMUSCULAR | Status: DC | PRN
  Administered 2019-11-07 (×2): 50 ug via INTRAVENOUS

## 2019-11-07 MED ORDER — SODIUM CHLORIDE 0.9 % IR SOLN
Status: DC | PRN
  Administered 2019-11-07: 3000 mL via INTRAVESICAL

## 2019-11-07 MED ORDER — PROMETHAZINE HCL 25 MG/ML IJ SOLN: 6.2500 mg | INTRAMUSCULAR | Status: DC | PRN

## 2019-11-07 MED ORDER — LIDOCAINE HCL (CARDIAC) PF 100 MG/5ML IV SOSY
PREFILLED_SYRINGE | INTRAVENOUS | Status: DC | PRN
  Administered 2019-11-07: 80 mg via INTRAVENOUS

## 2019-11-07 MED ORDER — SCOPOLAMINE 1 MG/3DAYS TD PT72
1.0000 | MEDICATED_PATCH | Freq: Once | TRANSDERMAL | Status: AC
  Administered 2019-11-07: 1 via TRANSDERMAL
  Filled 2019-11-07: qty 1

## 2019-11-07 MED ORDER — MIDAZOLAM HCL 2 MG/2ML IJ SOLN
INTRAMUSCULAR | Status: AC
  Filled 2019-11-07: qty 2

## 2019-11-07 MED ORDER — PROPOFOL 500 MG/50ML IV EMUL
INTRAVENOUS | Status: AC
  Filled 2019-11-07: qty 50

## 2019-11-07 MED ORDER — MIDAZOLAM HCL 5 MG/5ML IJ SOLN
INTRAMUSCULAR | Status: DC | PRN
  Administered 2019-11-07: 2 mg via INTRAVENOUS

## 2019-11-07 MED ORDER — IOHEXOL 300 MG/ML  SOLN
INTRAMUSCULAR | Status: DC | PRN
  Administered 2019-11-07: 19 mL

## 2019-11-07 MED ORDER — PROPOFOL 10 MG/ML IV BOLUS
INTRAVENOUS | Status: AC
  Filled 2019-11-07: qty 20

## 2019-11-07 MED ORDER — GENTAMICIN SULFATE 40 MG/ML IJ SOLN: 5.0000 mg/kg | INTRAVENOUS | Status: DC

## 2019-11-07 MED ORDER — LACTATED RINGERS IV SOLN
INTRAVENOUS | Status: DC
  Administered 2019-11-07: 1000 mL via INTRAVENOUS

## 2019-11-07 MED ORDER — OXYCODONE HCL 5 MG/5ML PO SOLN: 5.0000 mg | Freq: Once | ORAL | Status: DC | PRN

## 2019-11-07 MED ORDER — CHLORHEXIDINE GLUCONATE 0.12 % MT SOLN
15.0000 mL | Freq: Once | OROMUCOSAL | Status: AC
  Administered 2019-11-07: 15 mL via OROMUCOSAL

## 2019-11-07 MED ORDER — ACETAMINOPHEN 500 MG PO TABS
1000.0000 mg | ORAL_TABLET | Freq: Once | ORAL | Status: AC
  Administered 2019-11-07: 1000 mg via ORAL
  Filled 2019-11-07: qty 2

## 2019-11-07 MED ORDER — KETOROLAC TROMETHAMINE 10 MG PO TABS: 10.0000 mg | ORAL_TABLET | Freq: Three times a day (TID) | ORAL | 0 refills | Status: DC | PRN

## 2019-11-07 MED ORDER — OXYCODONE HCL 5 MG PO TABS: 5.0000 mg | ORAL_TABLET | Freq: Once | ORAL | Status: DC | PRN

## 2019-11-07 MED ORDER — PROPOFOL 1000 MG/100ML IV EMUL
INTRAVENOUS | Status: AC
  Filled 2019-11-07: qty 100

## 2019-11-07 MED ORDER — ONDANSETRON HCL 4 MG/2ML IJ SOLN
INTRAMUSCULAR | Status: DC | PRN
  Administered 2019-11-07: 4 mg via INTRAVENOUS

## 2019-11-07 SURGICAL SUPPLY — 24 items
BAG URO CATCHER STRL LF (MISCELLANEOUS) ×3 IMPLANT
BASKET LASER NITINOL 1.9FR (BASKET) IMPLANT
BSKT STON RTRVL 120 1.9FR (BASKET)
CATH INTERMIT  6FR 70CM (CATHETERS) ×3 IMPLANT
CLOTH BEACON ORANGE TIMEOUT ST (SAFETY) ×3 IMPLANT
EXTRACTOR STONE 1.7FRX115CM (UROLOGICAL SUPPLIES) IMPLANT
FIBER LASER FLEXIVA 1000 (UROLOGICAL SUPPLIES) IMPLANT
FIBER LASER FLEXIVA 365 (UROLOGICAL SUPPLIES) IMPLANT
FIBER LASER FLEXIVA 550 (UROLOGICAL SUPPLIES) IMPLANT
FIBER LASER TRAC TIP (UROLOGICAL SUPPLIES) ×2 IMPLANT
GLOVE BIOGEL M STRL SZ7.5 (GLOVE) ×3 IMPLANT
GOWN STRL REUS W/TWL LRG LVL3 (GOWN DISPOSABLE) ×3 IMPLANT
GUIDEWIRE ANG ZIPWIRE 038X150 (WIRE) ×5 IMPLANT
GUIDEWIRE STR DUAL SENSOR (WIRE) ×3 IMPLANT
KIT TURNOVER KIT A (KITS) IMPLANT
MANIFOLD NEPTUNE II (INSTRUMENTS) ×3 IMPLANT
PACK CYSTO (CUSTOM PROCEDURE TRAY) ×3 IMPLANT
SHEATH URETERAL 12FRX28CM (UROLOGICAL SUPPLIES) ×2 IMPLANT
SHEATH URETERAL 12FRX35CM (MISCELLANEOUS) IMPLANT
STENT POLARIS 5FRX24 (STENTS) ×2 IMPLANT
TUBE FEEDING 8FR 16IN STR KANG (MISCELLANEOUS) ×3 IMPLANT
TUBING CONNECTING 10 (TUBING) ×2 IMPLANT
TUBING CONNECTING 10' (TUBING) ×1
TUBING UROLOGY SET (TUBING) ×3 IMPLANT

## 2019-11-07 NOTE — Anesthesia Procedure Notes (Signed)
Procedure Name: LMA Insertion Date/Time: 11/07/2019 10:38 AM Performed by: Thornell Mule, CRNA Pre-anesthesia Checklist: Patient identified, Emergency Drugs available, Suction available and Patient being monitored Patient Re-evaluated:Patient Re-evaluated prior to induction Oxygen Delivery Method: Circle system utilized Preoxygenation: Pre-oxygenation with 100% oxygen Induction Type: IV induction LMA: LMA inserted LMA Size: 4.0 Number of attempts: 1 Placement Confirmation: positive ETCO2 Tube secured with: Tape Dental Injury: Teeth and Oropharynx as per pre-operative assessment

## 2019-11-07 NOTE — Anesthesia Preprocedure Evaluation (Addendum)
Anesthesia Evaluation  Patient identified by MRN, date of birth, ID band Patient awake    Reviewed: Allergy & Precautions, NPO status , Patient's Chart, lab work & pertinent test results, reviewed documented beta blocker date and time   History of Anesthesia Complications (+) PONV and history of anesthetic complications  Airway Mallampati: II  TM Distance: >3 FB Neck ROM: Full    Dental no notable dental hx.    Pulmonary neg pulmonary ROS,    Pulmonary exam normal        Cardiovascular hypertension, Pt. on home beta blockers and Pt. on medications Normal cardiovascular exam+ dysrhythmias Supra Ventricular Tachycardia      Neuro/Psych negative neurological ROS  negative psych ROS   GI/Hepatic negative GI ROS, Neg liver ROS,   Endo/Other  negative endocrine ROS  Renal/GU LEFT URETEROPELVIC JUNCTION CALCULUS  negative genitourinary   Musculoskeletal Congenital absence of right upper extremity   Abdominal   Peds  Hematology negative hematology ROS (+)   Anesthesia Other Findings Day of surgery medications reviewed with patient.  Reproductive/Obstetrics negative OB ROS                            Anesthesia Physical Anesthesia Plan  ASA: II  Anesthesia Plan: General   Post-op Pain Management:    Induction: Intravenous  PONV Risk Score and Plan: 4 or greater and Treatment may vary due to age or medical condition, Ondansetron, Dexamethasone, Midazolam, Scopolamine patch - Pre-op, Propofol infusion and TIVA  Airway Management Planned: LMA  Additional Equipment: None  Intra-op Plan:   Post-operative Plan: Extubation in OR  Informed Consent: I have reviewed the patients History and Physical, chart, labs and discussed the procedure including the risks, benefits and alternatives for the proposed anesthesia with the patient or authorized representative who has indicated his/her understanding  and acceptance.     Dental advisory given  Plan Discussed with: CRNA  Anesthesia Plan Comments:        Anesthesia Quick Evaluation

## 2019-11-07 NOTE — Transfer of Care (Signed)
Immediate Anesthesia Transfer of Care Note  Patient: [name] [name]  Procedure(s) Performed: CYSTOSCOPY WITH RETROGRADE PYELOGRAM, URETEROSCOPY AND STENT PLACEMENT (Left ) HOLMIUM LASER APPLICATION (Left )  Patient Location: PACU  Anesthesia Type:General  Level of Consciousness: drowsy, patient cooperative and responds to stimulation  Airway & Oxygen Therapy: Patient Spontanous Breathing and Patient connected to face mask oxygen  Post-op Assessment: Report given to RN and Post -op Vital signs reviewed and stable  Post vital signs: Reviewed and stable  Last Vitals:  Vitals Value Taken Time  BP 125/83 11/07/19 1232  Temp 36.3 C 11/07/19 1232  Pulse 60 11/07/19 1235  Resp 18 11/07/19 1235  SpO2 100 % 11/07/19 1235  Vitals shown include unvalidated device data.  Last Pain:  Vitals:   11/07/19 0850  TempSrc:   PainSc: 2          Complications: No complications documented.

## 2019-11-07 NOTE — Brief Op Note (Signed)
11/07/2019  12:22 PM  PATIENT:  [name] [name]  54 y.o. female  PRE-OPERATIVE DIAGNOSIS:  LEFT URETEROPELVIC JUNCTION CALCULUS  POST-OPERATIVE DIAGNOSIS:  LEFT URETEROPELVIC JUNCTION CALCULUS  PROCEDURE:  Procedure(s) with comments: CYSTOSCOPY WITH RETROGRADE PYELOGRAM, URETEROSCOPY AND STENT PLACEMENT (Left) - 90 MINS HOLMIUM LASER APPLICATION (Left)  SURGEON:  Surgeon(s) and Role:    Sebastian Ache, MD - Primary  PHYSICIAN ASSISTANT:   ASSISTANTS: none   ANESTHESIA:   general  EBL:  minimal   BLOOD ADMINISTERED:none  DRAINS: none   LOCAL MEDICATIONS USED:  NONE  SPECIMEN:  No Specimen  DISPOSITION OF SPECIMEN:  N/A  COUNTS:  YES  TOURNIQUET:  * No tourniquets in log *  DICTATION: .Other Dictation: Dictation Number [id number]  PLAN OF CARE: Discharge to home after PACU  PATIENT DISPOSITION:  PACU - hemodynamically stable.   Delay start of Pharmacological VTE agent (>24hrs) due to surgical blood loss or risk of bleeding: no

## 2019-11-07 NOTE — Anesthesia Postprocedure Evaluation (Signed)
Anesthesia Post Note  Patient: ZAIA CARRE  Procedure(s) Performed: CYSTOSCOPY WITH RETROGRADE PYELOGRAM, URETEROSCOPY AND STENT PLACEMENT (Left ) HOLMIUM LASER APPLICATION (Left )     Patient location during evaluation: PACU Anesthesia Type: General Level of consciousness: awake and alert and oriented Pain management: pain level controlled Vital Signs Assessment: post-procedure vital signs reviewed and stable Respiratory status: spontaneous breathing, nonlabored ventilation and respiratory function stable Cardiovascular status: blood pressure returned to baseline Postop Assessment: no apparent nausea or vomiting Anesthetic complications: no   No complications documented.  Last Vitals:  Vitals:   11/07/19 1245 11/07/19 1300  BP: 127/88 117/74  Pulse: 65 (!) 54  Resp: 17 13  Temp:    SpO2: 100% 100%    Last Pain:  Vitals:   11/07/19 1245  TempSrc:   PainSc: 5                  Kaylyn Layer

## 2019-11-07 NOTE — H&P (Signed)
[name] [name] is an 54 y.o. female.    Chief Complaint: Pre-Op LEFT 1st stage Ureteroscopic Stone Manipulation  HPI:   1 - Recurrent Urolithiasis  / Cystinuria -  10/2019 - 2cm left UPJ stone with moderate hydro, smaller left renal stone, punctate Rt (non-obstructing) by Ct on eval flank pain.   Today "[name] [name]" is seen to proceed with LEFT 1st stage ureteroscopy for large recurrent stone. Cr 1.2, Hgb 14, C19 screen negative. UCX negative. No interval fevers.   Past Medical History:  Diagnosis Date  . Congenital absence of right upper extremity    WEARS PROSTHESIS  RIGHT ARM  . History of kidney stones    CYSTINE STONES  . Hypertension   . Multiple thyroid nodules    W/ GOITER--  LAST BX  BENIGN  . PONV (postoperative nausea and vomiting)    PT STATES ZOFRAN IN SURGERY HELPS  . PSVT (paroxysmal supraventricular tachycardia) (HCC)    CARDIOLOGIST --  DR CRENSHAW  "NO PROBLEMS IN LONG TIME" WITH FAST HEART RATE  . Renal calculus, right   . Scarlet fever with other complications 1970    Past Surgical History:  Procedure Laterality Date  . CYSTO/  BILATERAL URETEROSCOPIC LASER LITHOTRIPSY/  BILATERAL URETERAL STONE EXTRACTION/  BILATERAL STENT PLACEMENT  01-22-2008  . CYSTO/  RIGHT RETROGRADE PYELOGRAM/ RIGHT URETEROSCOPY  02-10-2005/   02-17-2005/   03-18-2001   02-17-2005-- STENT PLACEMENT  . CYSTO/  RIGHT URETERAL STENT PLACEMENT  07-10-2000  . CYSTOSCOPY W/ RETROGRADES Right 05/21/2015   Procedure: CYSTOSCOPY WITH RETROGRADE PYELOGRAM;  Surgeon: Ihor Gully, MD;  Location: [facility];  Service: Urology;  Laterality: Right;  . CYSTOSCOPY WITH RETROGRADE PYELOGRAM, URETEROSCOPY AND STENT PLACEMENT Left 09/25/2013   Procedure: LEFT RETROGRADE PYELOGRAM, URETEROSCOPY LASER LITHO AND STENT PLACEMENT;  Surgeon: Garnett Farm, MD;  Location: [facility];  Service: Urology;  Laterality: Left;  . CYSTOSCOPY WITH RETROGRADE PYELOGRAM, URETEROSCOPY AND STENT  PLACEMENT Right 07/16/2015   Procedure: CYSTOSCOPY WITH RETROGRADE PYELOGRAM;  Surgeon: Ihor Gully, MD;  Location: [facility];  Service: Urology;  Laterality: Right;  . CYSTOSCOPY WITH URETEROSCOPY, STONE BASKETRY AND STENT PLACEMENT Right 07/16/2015   Procedure: CYSTOSCOPY WITH URETEROSCOPY, STONE BASKETRY;  Surgeon: Ihor Gully, MD;  Location: [facility];  Service: Urology;  Laterality: Right;  . CYSTOSCOPY/URETEROSCOPY/HOLMIUM LASER/STENT PLACEMENT Right 05/21/2015   Procedure: RIGHT URETEROSCOPY/HOLMIUM LASER LITHOTRIPSY/RIGHT STENT PLACEMENT;  Surgeon: Ihor Gully, MD;  Location: [facility];  Service: Urology;  Laterality: Right;  . CYSTOSCOPY/URETEROSCOPY/HOLMIUM LASER/STENT PLACEMENT Left 07/21/2019   Procedure: CYSTOSCOPY/URETEROSCOPY/HOLMIUM LASER/STENT PLACEMENT;  Surgeon: Bjorn Pippin, MD;  Location: [facility];  Service: Urology;  Laterality: Left;  . CYTSO/  LEFT URETERAL STENT PLACEMENT  04-12-2006  &  06-05-2002  . DILATION AND CURETTAGE OF UTERUS  04-09-2004   W/  SUCTION  . HOLMIUM LASER APPLICATION Left 09/25/2013   Procedure: HOLMIUM LASER APPLICATION;  Surgeon: Garnett Farm, MD;  Location: [facility];  Service: Urology;  Laterality: Left;  . HOLMIUM LASER APPLICATION N/A 12/15/2013   Procedure: HOLMIUM LASER APPLICATION;  Surgeon: Garnett Farm, MD;  Location: [facility];  Service: Urology;  Laterality: N/A;  . NEPHROLITHOTOMY Right 12/15/2013   Procedure: RIGHT PERCUTANEOUS NEPHROLITHOTOMY ;  Surgeon: Garnett Farm, MD;  Location: [facility];  Service: Urology;  Laterality: Right;   [place]  . PERCUTANEOUS NEPHROSTOLITHOTOMY Bilateral LEFT  05-30-2002/   RIGHT 12-13-1999  . RIGHT URETEROSCOPIC STONE EXTRACTION /  STENT PLACEMENT  07-03-2000    Family History  Problem Relation Age of Onset  . Healthy Mother   . Healthy Brother    Social History:  reports that she has never smoked. She has never used smokeless tobacco.  She reports current alcohol use. She reports that she does not use drugs.  Allergies:  Allergies  Allergen Reactions  . Demerol [Meperidine Hcl] Nausea And Vomiting  . Morphine Nausea And Vomiting  . Bactrim [Sulfamethoxazole-Trimethoprim] Hives    No medications prior to admission.    No results found for this or any previous visit (from the past 48 hour(s)). No results found.  Review of Systems  Constitutional: Negative for chills and fever.  Genitourinary: Positive for flank pain.  All other systems reviewed and are negative.   There were no vitals taken for this visit. Physical Exam Vitals reviewed.  HENT:     Head: Normocephalic.     Nose: Nose normal.  Eyes:     Pupils: Pupils are equal, round, and reactive to light.  Cardiovascular:     Rate and Rhythm: Normal rate.     Pulses: Normal pulses.  Pulmonary:     Effort: Pulmonary effort is normal.  Abdominal:     General: Abdomen is flat.  Genitourinary:    Comments: Minimal left CVAT at present.  Musculoskeletal:     Cervical back: Normal range of motion.     Comments: Stable UE prosthesis  Skin:    General: Skin is warm.  Neurological:     General: No focal deficit present.     Mental Status: She is alert.      Assessment/Plan  Proceed as planned with LEFT 1st stage ureteroscopy. Risks, benefits, alternatives, expected peri-op course discussed previously and reiterated today.   Sebastian Ache, MD 11/07/2019, 5:54 AM

## 2019-11-07 NOTE — Discharge Instructions (Signed)
1 - You may have urinary urgency (bladder spasms) and bloody urine on / off with stent in place. This is normal. ° °2 - Call MD or go to ER for fever >102, severe pain / nausea / vomiting not relieved by medications, or acute change in medical status ° °

## 2019-11-08 ENCOUNTER — Encounter (HOSPITAL_COMMUNITY): Payer: Self-pay | Admitting: Urology

## 2019-11-08 NOTE — Op Note (Signed)
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  PROCEDURE: 1.  Cystoscopy, left retrograde pyelogram interpretation. 2.  Left first-stage ureteroscopy, laser lithotripsy. 3.  Insertion of left ureteral stent 5 x 24 Polaris, no tether.  ESTIMATED BLOOD LOSS:  Nil.  MEDICATIONS:  None.  SPECIMENS:  None.  FINDINGS: 1.  Very impacted left junction stone, 2 cm consistent with cystinuria. 2.  Ablation of approximately 70% of stone material today. 3.  Successful placement of left ureteral stent proximal upper pole of the kidney and the distal end urinary bladder.  INDICATIONS:  The patient is a pleasant, unfortunate 54 year old woman with history of cystinuria.  She is status post innumerable prior surgeries including percutaneous surgery and ureteroscopy medical passage.  She was found on workup for colicky flank  pain to have a significant volume of recurrent stones including 2 cm at the area of the UPJ.  Given her cystinuria and the goal of avoidance of major surgery including percutaneous surgery or shock wave lithotripsy, options were discussed including  recommended path of staged ureteroscopy with goal of left-sided stone free and she wished to proceed.  Informed consent was obtained and placed in medical record.  DESCRIPTION OF PROCEDURE:  The patient was identified and the procedure was identified as being left stage ureteroscopic stimulation was confirmed.  Procedure timeout was performed.  Intravenous access administered.  General anesthesia induced.  The  patient was placed into a low lithotomy position, sterile field was created prepped and draped the patient's vagina, introitus and proximal thighs using iodine.   Cystourethroscopy was then performed using 21-French rigid cystoscope vessel lens.   Inspection of bladder revealed no diverticula, calcifications comparable lesions.  The left ureteral orifice was cannulated with a 6-French renal catheter and left retrograde pyelogram was obtained.  Intravenous pyelogram demonstrates a single left ureter.  There was a calcification and filling defect in proximal ureter consistent with known stone.  There was minimal contrast above this.  A 0.03 ZIPwire was attempted to be navigated past this stone;  however, it was not as completely impacted.  A straight  sensor wire was carefully navigated past this to lower pole  and exchanged for a wire acting as a safety wire.  Next semirigid ureteroscopy performed the distal 2/3  left ureter alongside a separate sensor working wire.  The very upper reaches of the ureteroscope.  The tip of the large UPJ stone could be seen.  The semirigid scope was exchanged for a 12/14 short length ureteral access sheath to the level of the  proximal ureter below the level of the stone and flexible digital ureteroscopy was performed.  This revealed excellent sheath placement approximately 3 cm distal to the area of the stone.  Stone as expected was quite impacted and very large with  significant mucosal edema.  Holmium laser energy was then applied to stone using a dusting technique, a setting of 0.2 joules and 20 Hz, escalating to 0.3 joules and 30 Hz and for approximately 90 minutes, the stone was very carefully ablated.  After  approximately half the volume of stone ablated.  It became free floating in the was retrograde position in the upper mid pole calix and the kidney where further laser lithotripsy was applied ablating additional portions of the stone, estimated,   approximately  70% today.  At this point, given the stone was very large volume there was so much stone dust and debris, that visualization was becoming quite poor and so the safest  means of management would be to stent today and proceed with planned  second-stage procedure in several weeks.  As such, the access sheath was removed under continuous vision.  No significant mucosal abnormalities were found other than the aforementioned edema in the area of prior stone impaction and a new 5 x 24  Polaris-type stent was placed using fluoroscopic guidance.  Good proximal and distal plane were noted, and the procedure was terminated.  The patient tolerated the procedure well.  No immediate complications.  The patient was taken to postanesthesia care  in stable condition with plan for discharge home.  PN/NUANCE  D:11/07/2019 T:11/08/2019 JOB:[id number]

## 2019-11-17 ENCOUNTER — Other Ambulatory Visit: Payer: Self-pay | Admitting: Urology

## 2019-11-17 NOTE — Progress Notes (Addendum)
Left Selita a message at Dr. Emmaline Life office that orders needed.  Needs orders for PAT visit on 11-20-19.  Patient scheduled for surgery on 11-26-19

## 2019-11-17 NOTE — Progress Notes (Addendum)
COVID Vaccine Completed: x2 Date COVID Vaccine completed: COVID vaccine manufacturer: Pfizer    [facility] [employer]   PCP - Dr. Gilman Schmidt Cardiologist - Dr. Olga Millers.  Last OV 03-08-18  No Back Stimulator  Chest x-ray -  EKG - 11-04-19  Stress Test -  ECHO - 03-01-17 in Epic Cardiac Cath -   Sleep Study -  CPAP -   Fasting Blood Sugar -  Checks Blood Sugar _____ times a day  Blood Thinner Instructions: Aspirin Instructions: Last Dose:  Anesthesia review:   Patient denies shortness of breath, fever, cough and chest pain at PAT appointment  No SOB with ADL;s  Patient verbalized understanding of instructions that were given to them at the PAT appointment. Patient was also instructed that they will need to review over the PAT instructions again at home before surgery.

## 2019-11-17 NOTE — Patient Instructions (Addendum)
DUE TO COVID-19 ONLY ONE VISITOR ARE ALLOWED TO COME WITH YOU AND STAY IN THE WAITING ROOM ONLY DURING PRE OP AND  PROCEDURE. THEN TWO VISITORS MAY VISIT WITH YOU IN YOUR PRIVATE ROOM DURING VISITING HOURS ONLY!!   COVID SWAB TESTING MUST BE COMPLETED ON:    11-22-19 @ 9:23 AM [address], [place] -[facility] enter pre surgical testing line (Must self quarantine after testing. Follow instructions on handout.)            Your procedure is scheduled on:  11-26-19   Report to [facility] Main  Entrance    Report to admitting at 1:30 PM   Call this number if you have problems the morning of surgery [phone]   Do not eat food:After Midnight.   May have liquids until 9:30 am day of surgery    CLEAR LIQUID DIET  Foods Allowed                                                                     Foods Excluded  Water, Black Coffee and tea, regular and decaf            liquids that you cannot  Plain Jell-O in any flavor  (No red)                                   see through such as: Fruit ices (not with fruit pulp)                                           milk, soups, orange juice  Iced Popsicles (No red)                                           All solid food                                   Apple juices Sports drinks like Gatorade (No red) Lightly seasoned clear broth or consume(fat free) Sugar, honey syrup    Take these medicines the morning of surgery with A SIP OF WATER:  Ketorolac (Toradol), prn  Oral Hygiene is also important to reduce your risk of infection.                                     Remember - BRUSH YOUR TEETH THE MORNING OF SURGERY WITH YOUR REGULAR TOOTHPASTE                             You may not have any metal on your body including hair pins, jewelry, and body piercings               Do not wear make-up, lotions, powders, perfumes, or deodorant  Do not wear nail polish.  Do not shave  48 hours prior to  surgery.                Do not bring valuables to the hospital. [facility] IS NOT RESPONSIBLE   FOR VALUABLES.   Contacts, dentures or bridgework may not be worn into surgery.   Patients discharged the day of surgery will not be allowed to drive home.  [name] [name] [phone]   Please read over the following fact sheets you were given: IF YOU HAVE QUESTIONS ABOUT YOUR PRE OP INSTRUCTIONS PLEASE CALL  [phone]    Before surgery, you can play an important role.  Because skin is not sterile, your skin needs to be as free of germs as possible.  You can reduce the number of germs on your skin by washing with CHG (chlorahexidine gluconate) soap before surgery.  CHG is an antiseptic cleaner which kills germs and bonds with the skin to continue killing germs even after washing. Please DO NOT use if you have an allergy to CHG or antibacterial soaps.  If your skin becomes reddened/irritated stop using the CHG and inform your nurse when you arrive at Short Stay. Do not shave (including legs and underarms) for at least 48 hours prior to the first CHG shower.  You may shave your face/neck.  Please follow these instructions carefully:  1.  Shower with CHG Soap the night before surgery and the  morning of surgery.  2.  If you choose to wash your hair, wash your hair first as usual with your normal  shampoo.  3.  After you shampoo, rinse your hair and body thoroughly to remove the shampoo.                             4.  Use CHG as you would any other liquid soap.  You can apply chg directly to the skin and wash.  Gently with a scrungie or clean washcloth.  5.  Apply the CHG Soap to your body ONLY FROM THE NECK DOWN.   Do   not use on face/ open                           Wound or open sores. Avoid contact with eyes, ears mouth and   genitals (private parts).                       Wash face,  Genitals (private parts) with your normal soap.             6.  Wash thoroughly, paying special attention  to the area where your    surgery  will be performed.  7.  Thoroughly rinse your body with warm water from the neck down.  8.  DO NOT shower/wash with your normal soap after using and rinsing off the CHG Soap.                9.  Pat yourself dry with a clean towel.            10.  Wear clean pajamas.            11.  Place clean sheets on your bed the night of your first shower and do not  sleep with pets. Day of Surgery : Do not apply any lotions/deodorants the morning of surgery.  Please  wear clean clothes to the hospital/surgery center.  FAILURE TO FOLLOW THESE INSTRUCTIONS MAY RESULT IN THE CANCELLATION OF YOUR SURGERY  PATIENT SIGNATURE_________________________________  NURSE SIGNATURE__________________________________  ________________________________________________________________________

## 2019-11-20 ENCOUNTER — Encounter (HOSPITAL_COMMUNITY)
Admission: RE | Admit: 2019-11-20 | Discharge: 2019-11-20 | Disposition: A | Discharge status: Home / Self Care | Payer: BC Managed Care – PPO | Source: Ambulatory Visit | Attending: Urology | Admitting: Urology

## 2019-11-20 ENCOUNTER — Other Ambulatory Visit: Payer: Self-pay

## 2019-11-20 ENCOUNTER — Encounter (HOSPITAL_COMMUNITY): Payer: Self-pay

## 2019-11-20 DIAGNOSIS — Z01812 Encounter for preprocedural laboratory examination: Secondary | ICD-10-CM | POA: Diagnosis not present

## 2019-11-20 LAB — CBC
HCT: 43.7 % (ref 36.0–46.0)
Hemoglobin: 14.8 g/dL (ref 12.0–15.0)
MCH: 29.6 pg (ref 26.0–34.0)
MCHC: 33.9 g/dL (ref 30.0–36.0)
MCV: 87.4 fL (ref 80.0–100.0)
Platelets: 299 10*3/uL (ref 150–400)
RBC: 5 MIL/uL (ref 3.87–5.11)
RDW: 12.6 % (ref 11.5–15.5)
WBC: 7.5 10*3/uL (ref 4.0–10.5)
nRBC: 0 % (ref 0.0–0.2)

## 2019-11-20 LAB — BASIC METABOLIC PANEL
Anion gap: 10 (ref 5–15)
BUN: 19 mg/dL (ref 6–20)
CO2: 22 mmol/L (ref 22–32)
Calcium: 9.1 mg/dL (ref 8.9–10.3)
Chloride: 105 mmol/L (ref 98–111)
Creatinine, Ser: 0.76 mg/dL (ref 0.44–1.00)
GFR calc Af Amer: >60 mL/min (ref >60)
GFR calc non Af Amer: >60 mL/min (ref >60)
Glucose, Bld: 97 mg/dL (ref 70–99)
Potassium: 4.5 mmol/L (ref 3.5–5.1)
Sodium: 137 mmol/L (ref 135–145)

## 2019-11-22 ENCOUNTER — Other Ambulatory Visit (HOSPITAL_COMMUNITY)
Admission: RE | Admit: 2019-11-22 | Discharge: 2019-11-22 | Disposition: A | Discharge status: Home / Self Care | Payer: BC Managed Care – PPO | Source: Ambulatory Visit | Attending: Urology | Admitting: Urology

## 2019-11-22 DIAGNOSIS — Z01812 Encounter for preprocedural laboratory examination: Secondary | ICD-10-CM | POA: Diagnosis not present

## 2019-11-22 DIAGNOSIS — Z20822 Contact with and (suspected) exposure to covid-19: Secondary | ICD-10-CM | POA: Diagnosis not present

## 2019-11-22 LAB — SARS CORONAVIRUS 2 (TAT 6-24 HRS): SARS Coronavirus 2: NEGATIVE

## 2019-11-26 ENCOUNTER — Ambulatory Visit (HOSPITAL_COMMUNITY): Payer: BC Managed Care – PPO | Admitting: Anesthesiology

## 2019-11-26 ENCOUNTER — Encounter (HOSPITAL_COMMUNITY): Payer: Self-pay | Admitting: Urology

## 2019-11-26 ENCOUNTER — Ambulatory Visit (HOSPITAL_COMMUNITY): Payer: BC Managed Care – PPO

## 2019-11-26 ENCOUNTER — Ambulatory Visit (HOSPITAL_COMMUNITY)
Admission: RE | Admit: 2019-11-26 | Discharge: 2019-11-26 | Disposition: A | Discharge status: Home / Self Care | Payer: BC Managed Care – PPO | Attending: Urology | Admitting: Urology

## 2019-11-26 ENCOUNTER — Encounter (HOSPITAL_COMMUNITY)
Admission: RE | Disposition: A | Discharge status: Home / Self Care | Payer: Self-pay | Source: Home / Self Care | Attending: Urology

## 2019-11-26 DIAGNOSIS — E042 Nontoxic multinodular goiter: Secondary | ICD-10-CM | POA: Diagnosis not present

## 2019-11-26 DIAGNOSIS — I10 Essential (primary) hypertension: Secondary | ICD-10-CM | POA: Diagnosis not present

## 2019-11-26 DIAGNOSIS — Z79899 Other long term (current) drug therapy: Secondary | ICD-10-CM | POA: Diagnosis not present

## 2019-11-26 DIAGNOSIS — Z87442 Personal history of urinary calculi: Secondary | ICD-10-CM | POA: Diagnosis not present

## 2019-11-26 DIAGNOSIS — N200 Calculus of kidney: Secondary | ICD-10-CM | POA: Diagnosis not present

## 2019-11-26 HISTORY — PX: CYSTOSCOPY WITH RETROGRADE PYELOGRAM, URETEROSCOPY AND STENT PLACEMENT: SHX5789

## 2019-11-26 HISTORY — PX: HOLMIUM LASER APPLICATION: SHX5852

## 2019-11-26 SURGERY — CYSTOURETEROSCOPY, WITH RETROGRADE PYELOGRAM AND STENT INSERTION
Anesthesia: General | Laterality: Left

## 2019-11-26 MED ORDER — FENTANYL CITRATE (PF) 100 MCG/2ML IJ SOLN
25.0000 ug | INTRAMUSCULAR | Status: DC | PRN
  Administered 2019-11-26: 50 ug via INTRAVENOUS

## 2019-11-26 MED ORDER — ACETAMINOPHEN 500 MG PO TABS
1000.0000 mg | ORAL_TABLET | Freq: Once | ORAL | Status: AC
  Administered 2019-11-26: 1000 mg via ORAL
  Filled 2019-11-26: qty 2

## 2019-11-26 MED ORDER — KETOROLAC TROMETHAMINE 10 MG PO TABS: 10.0000 mg | ORAL_TABLET | Freq: Three times a day (TID) | ORAL | 0 refills | Status: DC | PRN

## 2019-11-26 MED ORDER — CEPHALEXIN 500 MG PO CAPS: 500.0000 mg | ORAL_CAPSULE | Freq: Two times a day (BID) | ORAL | 0 refills | Status: DC

## 2019-11-26 MED ORDER — IOHEXOL 300 MG/ML  SOLN
INTRAMUSCULAR | Status: DC | PRN
  Administered 2019-11-26: 12 mL

## 2019-11-26 MED ORDER — SCOPOLAMINE 1 MG/3DAYS TD PT72
1.0000 | MEDICATED_PATCH | TRANSDERMAL | Status: DC
  Administered 2019-11-26: 1.5 mg via TRANSDERMAL
  Filled 2019-11-26: qty 1

## 2019-11-26 MED ORDER — FENTANYL CITRATE (PF) 100 MCG/2ML IJ SOLN
INTRAMUSCULAR | Status: DC | PRN
  Administered 2019-11-26: 50 ug via INTRAVENOUS

## 2019-11-26 MED ORDER — DEXAMETHASONE SODIUM PHOSPHATE 10 MG/ML IJ SOLN
INTRAMUSCULAR | Status: AC
  Filled 2019-11-26: qty 1

## 2019-11-26 MED ORDER — OXYCODONE-ACETAMINOPHEN 5-325 MG PO TABS: 1.0000 | ORAL_TABLET | Freq: Four times a day (QID) | ORAL | 0 refills | Status: DC | PRN

## 2019-11-26 MED ORDER — LACTATED RINGERS IV SOLN: INTRAVENOUS | Status: DC

## 2019-11-26 MED ORDER — PROPOFOL 10 MG/ML IV BOLUS
INTRAVENOUS | Status: DC | PRN
  Administered 2019-11-26: 100 mg via INTRAVENOUS
  Administered 2019-11-26: 30 mg via INTRAVENOUS

## 2019-11-26 MED ORDER — DEXAMETHASONE SODIUM PHOSPHATE 10 MG/ML IJ SOLN
INTRAMUSCULAR | Status: DC | PRN
Start: 2019-11-26 — End: 2019-11-26
  Administered 2019-11-26: 10 mg via INTRAVENOUS

## 2019-11-26 MED ORDER — ONDANSETRON HCL 4 MG/2ML IJ SOLN
INTRAMUSCULAR | Status: AC
  Filled 2019-11-26: qty 2

## 2019-11-26 MED ORDER — PROPOFOL 500 MG/50ML IV EMUL
INTRAVENOUS | Status: DC | PRN
Start: 2019-11-26 — End: 2019-11-26
  Administered 2019-11-26: 150 ug/kg/min via INTRAVENOUS

## 2019-11-26 MED ORDER — LIDOCAINE 2% (20 MG/ML) 5 ML SYRINGE
INTRAMUSCULAR | Status: AC
  Filled 2019-11-26: qty 5

## 2019-11-26 MED ORDER — DIPHENHYDRAMINE HCL 50 MG/ML IJ SOLN
INTRAMUSCULAR | Status: AC
  Filled 2019-11-26: qty 1

## 2019-11-26 MED ORDER — FENTANYL CITRATE (PF) 100 MCG/2ML IJ SOLN
INTRAMUSCULAR | Status: AC
  Filled 2019-11-26: qty 2

## 2019-11-26 MED ORDER — ORAL CARE MOUTH RINSE: 15.0000 mL | Freq: Once | OROMUCOSAL | Status: AC

## 2019-11-26 MED ORDER — GENTAMICIN SULFATE 40 MG/ML IJ SOLN
5.0000 mg/kg | INTRAVENOUS | Status: AC
  Administered 2019-11-26: 370 mg via INTRAVENOUS
  Filled 2019-11-26: qty 9.25

## 2019-11-26 MED ORDER — SODIUM CHLORIDE 0.9 % IR SOLN
Status: DC | PRN
  Administered 2019-11-26: 3000 mL via INTRAVESICAL

## 2019-11-26 MED ORDER — 0.9 % SODIUM CHLORIDE (POUR BTL) OPTIME
TOPICAL | Status: DC | PRN
  Administered 2019-11-26: 1000 mL

## 2019-11-26 MED ORDER — CHLORHEXIDINE GLUCONATE 0.12 % MT SOLN
15.0000 mL | Freq: Once | OROMUCOSAL | Status: AC
  Administered 2019-11-26: 15 mL via OROMUCOSAL

## 2019-11-26 MED ORDER — MIDAZOLAM HCL 5 MG/5ML IJ SOLN
INTRAMUSCULAR | Status: DC | PRN
  Administered 2019-11-26: 2 mg via INTRAVENOUS

## 2019-11-26 MED ORDER — LIDOCAINE HCL (CARDIAC) PF 100 MG/5ML IV SOSY
PREFILLED_SYRINGE | INTRAVENOUS | Status: DC | PRN
  Administered 2019-11-26: 60 mg via INTRAVENOUS

## 2019-11-26 MED ORDER — PROMETHAZINE HCL 25 MG/ML IJ SOLN: 6.2500 mg | INTRAMUSCULAR | Status: DC | PRN

## 2019-11-26 MED ORDER — DIPHENHYDRAMINE HCL 50 MG/ML IJ SOLN
INTRAMUSCULAR | Status: DC | PRN
Start: 2019-11-26 — End: 2019-11-26
  Administered 2019-11-26: 12.5 mg via INTRAVENOUS

## 2019-11-26 MED ORDER — MIDAZOLAM HCL 2 MG/2ML IJ SOLN
INTRAMUSCULAR | Status: AC
  Filled 2019-11-26: qty 2

## 2019-11-26 SURGICAL SUPPLY — 25 items
BAG URO CATCHER STRL LF (MISCELLANEOUS) ×3 IMPLANT
BASKET LASER NITINOL 1.9FR (BASKET) IMPLANT
BASKET STONE NCOMPASS (UROLOGICAL SUPPLIES) ×2 IMPLANT
BSKT STON RTRVL 120 1.9FR (BASKET)
CATH INTERMIT  6FR 70CM (CATHETERS) ×3 IMPLANT
CLOTH BEACON ORANGE TIMEOUT ST (SAFETY) ×3 IMPLANT
EXTRACTOR STONE 1.7FRX115CM (UROLOGICAL SUPPLIES) IMPLANT
FIBER LASER FLEXIVA 1000 (UROLOGICAL SUPPLIES) IMPLANT
FIBER LASER FLEXIVA 365 (UROLOGICAL SUPPLIES) IMPLANT
FIBER LASER FLEXIVA 550 (UROLOGICAL SUPPLIES) IMPLANT
FIBER LASER TRAC TIP (UROLOGICAL SUPPLIES) ×2 IMPLANT
GLOVE BIOGEL M STRL SZ7.5 (GLOVE) ×3 IMPLANT
GOWN STRL REUS W/TWL LRG LVL3 (GOWN DISPOSABLE) ×3 IMPLANT
GUIDEWIRE ANG ZIPWIRE 038X150 (WIRE) ×3 IMPLANT
GUIDEWIRE STR DUAL SENSOR (WIRE) ×3 IMPLANT
KIT TURNOVER KIT A (KITS) IMPLANT
MANIFOLD NEPTUNE II (INSTRUMENTS) ×3 IMPLANT
PACK CYSTO (CUSTOM PROCEDURE TRAY) ×3 IMPLANT
SHEATH URETERAL 12FRX28CM (UROLOGICAL SUPPLIES) ×2 IMPLANT
SHEATH URETERAL 12FRX35CM (MISCELLANEOUS) IMPLANT
STENT POLARIS 5FRX24 (STENTS) ×2 IMPLANT
TUBE FEEDING 8FR 16IN STR KANG (MISCELLANEOUS) ×3 IMPLANT
TUBING CONNECTING 10 (TUBING) ×2 IMPLANT
TUBING CONNECTING 10' (TUBING) ×1
TUBING UROLOGY SET (TUBING) ×3 IMPLANT

## 2019-11-26 NOTE — Transfer of Care (Signed)
Immediate Anesthesia Transfer of Care Note  Patient: [name] [name]  Procedure(s) Performed: CYSTOSCOPY WITH RETROGRADE PYELOGRAM, URETEROSCOPY AND STENT EXCHANGE (Left ) HOLMIUM LASER APPLICATION (Left )  Patient Location: PACU  Anesthesia Type:General  Level of Consciousness: drowsy, patient cooperative and responds to stimulation  Airway & Oxygen Therapy: Patient Spontanous Breathing and Patient connected to face mask oxygen  Post-op Assessment: Report given to RN and Post -op Vital signs reviewed and stable  Post vital signs: Reviewed and stable  Last Vitals:  Vitals Value Taken Time  BP 127/96 11/26/19 1618  Temp    Pulse 71 11/26/19 1620  Resp 14 11/26/19 1620  SpO2 100 % 11/26/19 1620  Vitals shown include unvalidated device data.  Last Pain:  Vitals:   11/26/19 1352  TempSrc:   PainSc: 0-No pain         Complications: No complications documented.

## 2019-11-26 NOTE — H&P (Signed)
[name] [name] is an 54 y.o. female.    Chief Complaint: Pre-Op LEFT 2nd stage ureteroscopic stone manipulation  HPI:    1 - Recurrent Urolithiasis  / Cystinuria -  10/2019 - 2cm left UPJ stone with moderate hydro, smaller left renal stone, punctate Rt (non-obstructing) by CT. She underwent 1st stage procedure on 7/2 at which point it was felt that >50% of stone volume addressed.   Today "Modest" is seen to proceed with LEFT 2nd stage ureteroscopy for large recurrent stone.  C19 screen negative. UCX negative. No interval fevers. One interval NP visit for dysuria and TX'd empiric keflex, final CX negative.   Past Medical History:  Diagnosis Date  . Congenital absence of right upper extremity    WEARS PROSTHESIS  RIGHT ARM  . History of kidney stones    CYSTINE STONES  . Hypertension   . Multiple thyroid nodules    W/ GOITER--  LAST BX  BENIGN  . PONV (postoperative nausea and vomiting)    PT STATES ZOFRAN IN SURGERY HELPS  . PSVT (paroxysmal supraventricular tachycardia) (HCC)    CARDIOLOGIST --  DR CRENSHAW  "NO PROBLEMS IN LONG TIME" WITH FAST HEART RATE  . Renal calculus, right   . Scarlet fever with other complications 1970    Past Surgical History:  Procedure Laterality Date  . CYSTO/  BILATERAL URETEROSCOPIC LASER LITHOTRIPSY/  BILATERAL URETERAL STONE EXTRACTION/  BILATERAL STENT PLACEMENT  01-22-2008  . CYSTO/  RIGHT RETROGRADE PYELOGRAM/ RIGHT URETEROSCOPY  02-10-2005/   02-17-2005/   03-18-2001   02-17-2005-- STENT PLACEMENT  . CYSTO/  RIGHT URETERAL STENT PLACEMENT  07-10-2000  . CYSTOSCOPY W/ RETROGRADES Right 05/21/2015   Procedure: CYSTOSCOPY WITH RETROGRADE PYELOGRAM;  Surgeon: Ihor Gully, MD;  Location: [facility];  Service: Urology;  Laterality: Right;  . CYSTOSCOPY WITH RETROGRADE PYELOGRAM, URETEROSCOPY AND STENT PLACEMENT Left 09/25/2013   Procedure: LEFT RETROGRADE PYELOGRAM, URETEROSCOPY LASER LITHO AND STENT PLACEMENT;  Surgeon: Garnett Farm, MD;  Location: [facility];  Service: Urology;  Laterality: Left;  . CYSTOSCOPY WITH RETROGRADE PYELOGRAM, URETEROSCOPY AND STENT PLACEMENT Right 07/16/2015   Procedure: CYSTOSCOPY WITH RETROGRADE PYELOGRAM;  Surgeon: Ihor Gully, MD;  Location: [facility];  Service: Urology;  Laterality: Right;  . CYSTOSCOPY WITH RETROGRADE PYELOGRAM, URETEROSCOPY AND STENT PLACEMENT Left 11/07/2019   Procedure: CYSTOSCOPY WITH RETROGRADE PYELOGRAM, URETEROSCOPY AND STENT PLACEMENT;  Surgeon: Sebastian Ache, MD;  Location: [facility];  Service: Urology;  Laterality: Left;  90 MINS  . CYSTOSCOPY WITH URETEROSCOPY, STONE BASKETRY AND STENT PLACEMENT Right 07/16/2015   Procedure: CYSTOSCOPY WITH URETEROSCOPY, STONE BASKETRY;  Surgeon: Ihor Gully, MD;  Location: [facility];  Service: Urology;  Laterality: Right;  . CYSTOSCOPY/URETEROSCOPY/HOLMIUM LASER/STENT PLACEMENT Right 05/21/2015   Procedure: RIGHT URETEROSCOPY/HOLMIUM LASER LITHOTRIPSY/RIGHT STENT PLACEMENT;  Surgeon: Ihor Gully, MD;  Location: [facility];  Service: Urology;  Laterality: Right;  . CYSTOSCOPY/URETEROSCOPY/HOLMIUM LASER/STENT PLACEMENT Left 07/21/2019   Procedure: CYSTOSCOPY/URETEROSCOPY/HOLMIUM LASER/STENT PLACEMENT;  Surgeon: Bjorn Pippin, MD;  Location: [facility];  Service: Urology;  Laterality: Left;  . CYTSO/  LEFT URETERAL STENT PLACEMENT  04-12-2006  &  06-05-2002  . DILATION AND CURETTAGE OF UTERUS  04-09-2004   W/  SUCTION  . HOLMIUM LASER APPLICATION Left 09/25/2013   Procedure: HOLMIUM LASER APPLICATION;  Surgeon: Garnett Farm, MD;  Location: [facility];  Service: Urology;  Laterality: Left;  . HOLMIUM LASER APPLICATION N/A 12/15/2013   Procedure: HOLMIUM LASER  APPLICATION;  Surgeon: Garnett Farm, MD;  Location: [facility];  Service: Urology;  Laterality: N/A;  . HOLMIUM LASER APPLICATION Left 11/07/2019   Procedure: HOLMIUM LASER APPLICATION;  Surgeon: Sebastian Ache, MD;  Location[facility];  Service: Urology;  Laterality: Left;  . NEPHROLITHOTOMY Right 12/15/2013   Procedure: RIGHT PERCUTANEOUS NEPHROLITHOTOMY ;  Surgeon: Garnett Farm, MD;  Location: [facility];  Service: Urology;  Laterality: Right;   staghorn  . PERCUTANEOUS NEPHROSTOLITHOTOMY Bilateral LEFT  05-30-2002/   RIGHT 12-13-1999  . RIGHT URETEROSCOPIC STONE EXTRACTION / STENT PLACEMENT  07-03-2000    Family History  Problem Relation Age of Onset  . Healthy Mother   . Healthy Brother    Social History:  reports that she has never smoked. She has never used smokeless tobacco. She reports current alcohol use. She reports that she does not use drugs.  Allergies:  Allergies  Allergen Reactions  . Demerol [Meperidine Hcl] Nausea And Vomiting  . Morphine Nausea And Vomiting  . Bactrim [Sulfamethoxazole-Trimethoprim] Hives    No medications prior to admission.    No results found for this or any previous visit (from the past 48 hour(s)). No results found.  Review of Systems  Constitutional: Negative for chills and fever.  Genitourinary: Positive for urgency.  All other systems reviewed and are negative.   There were no vitals taken for this visit. Physical Exam Vitals reviewed.  HENT:     Head: Normocephalic.     Nose: Nose normal.  Eyes:     Pupils: Pupils are equal, round, and reactive to light.  Cardiovascular:     Rate and Rhythm: Normal rate.     Pulses: Normal pulses.  Pulmonary:     Effort: Pulmonary effort is normal.  Abdominal:     General: Abdomen is flat.  Genitourinary:    Comments: No CVAT at present.  Musculoskeletal:     Cervical back: Normal range of motion.     Comments: Stable UE arm prosthesis  Skin:    General: Skin is warm.  Neurological:     General: No focal deficit present.     Mental Status: She is alert.  Psychiatric:        Mood and Affect: Mood normal.      Assessment/Plan  Proceed with LEFT 2nd stage ureteroscopic stone  manipulation. Risks, benefits, expected peri-op course discussed previously and reiterated today.   Sebastian Ache, MD 11/26/2019, 5:44 AM

## 2019-11-26 NOTE — Anesthesia Procedure Notes (Signed)
Procedure Name: LMA Insertion Date/Time: 11/26/2019 3:31 PM Performed by: Thornell Mule, CRNA Pre-anesthesia Checklist: Patient identified, Emergency Drugs available, Suction available and Patient being monitored Patient Re-evaluated:Patient Re-evaluated prior to induction Oxygen Delivery Method: Circle system utilized Preoxygenation: Pre-oxygenation with 100% oxygen Induction Type: IV induction LMA: LMA inserted LMA Size: 4.0 Number of attempts: 1 Placement Confirmation: positive ETCO2 Tube secured with: Tape Dental Injury: Teeth and Oropharynx as per pre-operative assessment

## 2019-11-26 NOTE — Discharge Instructions (Signed)
1 - You may have urinary urgency (bladder spasms) and bloody urine on / off with stent in place. This is normal.  2 - Remove tethered stent on Friday morning at home by pulling on string, then blue-white plastic tubing, and discarding. Office is open Friday any problems arise.   3 - Call MD or go to ER for fever >102, severe pain / nausea / vomiting not relieved by medications, or acute change in medical status

## 2019-11-26 NOTE — Anesthesia Postprocedure Evaluation (Signed)
Anesthesia Post Note  Patient: LOIDA CALAMIA  Procedure(s) Performed: CYSTOSCOPY WITH RETROGRADE PYELOGRAM, URETEROSCOPY AND STENT EXCHANGE (Left ) HOLMIUM LASER APPLICATION (Left )     Patient location during evaluation: PACU Anesthesia Type: General Level of consciousness: awake and alert and oriented Pain management: pain level controlled Vital Signs Assessment: post-procedure vital signs reviewed and stable Respiratory status: spontaneous breathing, nonlabored ventilation and respiratory function stable Cardiovascular status: blood pressure returned to baseline and stable Postop Assessment: no apparent nausea or vomiting Anesthetic complications: no   No complications documented.  Last Vitals:  Vitals:   11/26/19 1645 11/26/19 1700  BP: 117/70 112/70  Pulse: (!) 57 (!) 57  Resp: 14 14  Temp:    SpO2: 100% 100%    Last Pain:  Vitals:   11/26/19 1700  TempSrc:   PainSc: 5                  Satina Jerrell A.

## 2019-11-26 NOTE — Brief Op Note (Signed)
11/26/2019  4:08 PM  PATIENT:  Charisse March  54 y.o. female  PRE-OPERATIVE DIAGNOSIS:  LEFT URETEROPELVIC JUNCTION CALCULUS  POST-OPERATIVE DIAGNOSIS:  LEFT URETEROPELVIC JUNCTION CALCULUS  PROCEDURE:  Procedure(s) with comments: CYSTOSCOPY WITH RETROGRADE PYELOGRAM, URETEROSCOPY AND STENT EXCHANGE (Left) - 75 MINS HOLMIUM LASER APPLICATION (Left)  SURGEON:  Surgeon(s) and Role:    Sebastian Ache, MD - Primary  PHYSICIAN ASSISTANT:   ASSISTANTS: none   ANESTHESIA:   general  EBL:  minimal   BLOOD ADMINISTERED:none  DRAINS: none   LOCAL MEDICATIONS USED:  NONE  SPECIMEN:  Source of Specimen:  left renal stone fragments  DISPOSITION OF SPECIMEN:  discard  COUNTS:  YES  TOURNIQUET:  * No tourniquets in log *  DICTATION: .Other Dictation: Dictation Number [id number]  PLAN OF CARE: Discharge to home after PACU  PATIENT DISPOSITION:  PACU - hemodynamically stable.   Delay start of Pharmacological VTE agent (>24hrs) due to surgical blood loss or risk of bleeding: not applicable

## 2019-11-26 NOTE — Anesthesia Preprocedure Evaluation (Addendum)
Anesthesia Evaluation  Patient identified by MRN, date of birth, ID band Patient awake    Reviewed: Allergy & Precautions, NPO status , Patient's Chart, lab work & pertinent test results, reviewed documented beta blocker date and time   History of Anesthesia Complications (+) PONV and history of anesthetic complications  Airway Mallampati: II  TM Distance: >3 FB Neck ROM: Full    Dental  (+) Teeth Intact, Dental Advisory Given   Pulmonary neg pulmonary ROS,    Pulmonary exam normal breath sounds clear to auscultation       Cardiovascular hypertension, Pt. on home beta blockers Normal cardiovascular exam+ dysrhythmias Supra Ventricular Tachycardia  Rhythm:Regular Rate:Normal     Neuro/Psych negative neurological ROS  negative psych ROS   GI/Hepatic negative GI ROS, Neg liver ROS,   Endo/Other  negative endocrine ROS  Renal/GU Renal disease (LEFT URETEROPELVIC JUNCTION CALCULUS)     Musculoskeletal negative musculoskeletal ROS (+)   Abdominal   Peds  Hematology negative hematology ROS (+)   Anesthesia Other Findings Day of surgery medications reviewed with the patient.  Reproductive/Obstetrics                             Anesthesia Physical Anesthesia Plan  ASA: II  Anesthesia Plan: General   Post-op Pain Management:    Induction: Intravenous  PONV Risk Score and Plan: 4 or greater and Scopolamine patch - Pre-op, Midazolam, Dexamethasone, Ondansetron, TIVA and Propofol infusion  Airway Management Planned: LMA  Additional Equipment:   Intra-op Plan:   Post-operative Plan: Extubation in OR  Informed Consent: I have reviewed the patients History and Physical, chart, labs and discussed the procedure including the risks, benefits and alternatives for the proposed anesthesia with the patient or authorized representative who has indicated his/her understanding and acceptance.      Dental advisory given  Plan Discussed with: CRNA  Anesthesia Plan Comments:        Anesthesia Quick Evaluation

## 2019-11-27 ENCOUNTER — Encounter (HOSPITAL_COMMUNITY): Payer: Self-pay | Admitting: Urology

## 2019-11-27 NOTE — Op Note (Signed)
NAME: [name] [name], [name] [name] MEDICAL RECORD [id number] ACCOUNT [id number] DATE OF BIRTH:06-09-1965 FACILITY: [facility] LOCATION: [facility] PHYSICIAN:Sinead Hockman Berneice Heinrich, MD  OPERATIVE REPORT  DATE OF PROCEDURE:  11/26/2019  PREOPERATIVE DIAGNOSIS:  Large left renal stone residual fragments.  PROCEDURE: 1.  Cystoscopy with left retrograde pyelogram interpretation. 2.  Exchange of left ureteral stent 5 x 24 Polaris with tether. 3.  Left ureteroscopy with 2nd-stage laser lithotripsy.  ESTIMATED BLOOD LOSS:  Nil.  COMPLICATIONS:  None.  SPECIMEN:  Residual left renal stone fragments for discard.  FINDINGS: 1.  Significant interval passage of stone dust 2nd-stage procedure. 2.  Approximately 4 to 5 mm total stone volume residual in the lower pole.  There was complete resolution of all accessible stone fragments larger than 1/3 mm following laser lithotripsy extraction. 3.  Successful replacement of left ureteral stent proximal end-stage renal pelvis, distal end urinary bladder.  INDICATIONS:  The patient is a very pleasant but unfortunate 54 year old woman with history of cystinuria.  She has recurrent urolithiasis, status post innumerable prior surgeries.  She is still with colicky flank pain to have a recurrent left UPJ stone  at over 2 cm.  Options were discussed for management including recommended path of staged ureteroscopy with goal of stone free and she wished to proceed.  She underwent 1st-stage procedure on 07/02, which she tolerated well and it was felt that the  majority of her stone had been ablated but given her high risk stone disease it was clear that 2nd-stage be warranted to verify stone free status and she presents for this today.  Most recent urinary culture is negative.  Informed consent was obtained  and placed in medical record.  DESCRIPTION OF PROCEDURE:  Patient being patient, procedure being left 2nd-stage ureteroscopic stimulation was confirmed.  Procedure timeout was  performed.  Intravenous antibiotics administered.  General anesthesia induced.  The patient was placed into a  low lithotomy position, sterile field was created prepping and draping the patient's vagina, introitus and proximal thighs using iodine.  Cystourethroscopy was performed using 21-French rigid cystoscope vessel lens.  Inspection of bladder revealed no  diverticula, calcifications, or papillary lesions.  Distal end of left ureteral stent was seen in situ.  It was grasped, brought to the level of the urethral meatus, at which 0.03 ZIPwire was advanced and the stent was exchanged for a 6-French open-ended  catheter and left retrograde pyelogram was obtained.  Left retrograde pyelogram reveals single left ureter single system left kidney.  No obvious filling defects or narrowing noted.  The ZIPwire was once again advanced as a safety wire.  An 8-French feeding tube was placed in the urinary bladder for  pressure release, and semirigid ureteroscopy performed distal 4/5 of the left ureter alongside a separate sensor working wire.  No mucosal abnormalities were found.  The semirigid scope was exchanged for a 12/14 short length ureteral access sheath to the  level of the proximal ureter using continuous fluoroscopic guidance and flexible digital ureteroscopy performed the proximal left ureter and systematic inspection of left kidney, including all calices x3.  There was significant interval passage of the  stone dust from her ablated prior stone.  Only residual stone approximately 5 to 6 mm total volume in the lower pole.  One dominant fragment approximately 3 mm.  Holmium laser energy then applied to stone dominant fragment using settings of 0.2 joules  and 20 Hz.  It was fragmented into approximately 3 smaller pieces.  Then, an NCompass basket was used to remove  all accessible fragments such that all fragments larger than 1/3 mm were removed.  We achieved the goals of surgery today.  There was  excellent  hemostasis.  No evidence of any sort of perforation.  The access sheath was removed under continuous vision, no mucosal abnormalities were found.  Given access sheath usage it was felt that brief interval stenting with a tethered stent be  warranted.  As such, a new 5 x 24 Polaris-type stent was placed remaining safety wire using fluoroscopic guidance.  Good pulses plane were noted.  Tether was shortened and tucked per vagina and the procedure was terminated.  The patient tolerated the  procedure well.  No immediate perioperative complications.  The patient taken to postanesthesia care in stable condition.  Plan for discharge home.  CN/NUANCE  D:11/26/2019 T:11/27/2019 JOB:[id number]

## 2019-12-03 ENCOUNTER — Other Ambulatory Visit: Payer: Self-pay | Admitting: Cardiology

## 2020-01-07 NOTE — Progress Notes (Signed)
HPI: FU hypertension, SVT. According to previous notes when she was cared for by Dr Corinda Gubler, she has a history of SVT although I do not have actual strips. Nuclear study 2003 showed no scar or ischemia with an ejection fraction 74%. Previously seen with palpitations; electrocardiogram was performed and revealed sinus rhythm with PVCs. Echo 10/18 showed normal LV function and mild LAE. Since I last saw her,  she denies dyspnea, chest pain, palpitations or syncope.  Current Outpatient Medications  Medication Sig Dispense Refill  . metoprolol succinate (TOPROL-XL) 50 MG 24 hr tablet TAKE 1 TABLET DAILY. MUST KEEP APPOINTMENT. 30 tablet 0  . omeprazole (PRILOSEC) 20 MG capsule Take 20 mg by mouth daily as needed (indigestion/heartburn.).     Marland Kitchen potassium citrate (UROCIT-K) 10 MEQ (1080 MG) SR tablet Take 20 mEq by mouth daily.     No current facility-administered medications for this visit.     Past Medical History:  Diagnosis Date  . Congenital absence of right upper extremity    WEARS PROSTHESIS  RIGHT ARM  . History of kidney stones    CYSTINE STONES  . Hypertension   . Multiple thyroid nodules    W/ GOITER--  LAST BX  BENIGN  . PONV (postoperative nausea and vomiting)    PT STATES ZOFRAN IN SURGERY HELPS  . PSVT (paroxysmal supraventricular tachycardia) (HCC)    CARDIOLOGIST --  DR Ramesh Moan  "NO PROBLEMS IN LONG TIME" WITH FAST HEART RATE  . Renal calculus, right   . Scarlet fever with other complications 1970    Past Surgical History:  Procedure Laterality Date  . CYSTO/  BILATERAL URETEROSCOPIC LASER LITHOTRIPSY/  BILATERAL URETERAL STONE EXTRACTION/  BILATERAL STENT PLACEMENT  01-22-2008  . CYSTO/  RIGHT RETROGRADE PYELOGRAM/ RIGHT URETEROSCOPY  02-10-2005/   02-17-2005/   03-18-2001   02-17-2005-- STENT PLACEMENT  . CYSTO/  RIGHT URETERAL STENT PLACEMENT  07-10-2000  . CYSTOSCOPY W/ RETROGRADES Right 05/21/2015   Procedure: CYSTOSCOPY WITH RETROGRADE PYELOGRAM;   Surgeon: Ihor Gully, MD;  [facility];  Service: Urology;  Laterality: Right;  . CYSTOSCOPY WITH RETROGRADE PYELOGRAM, URETEROSCOPY AND STENT PLACEMENT Left 09/25/2013   Procedure: LEFT RETROGRADE PYELOGRAM, URETEROSCOPY LASER LITHO AND STENT PLACEMENT;  Surgeon: Garnett Farm, MD;  Location[facility];  Service: Urology;  Laterality: Left;  . CYSTOSCOPY WITH RETROGRADE PYELOGRAM, URETEROSCOPY AND STENT PLACEMENT Right 07/16/2015   Procedure: CYSTOSCOPY WITH RETROGRADE PYELOGRAM;  Surgeon: Ihor Gully, MD;  Location: [facility];  Service: Urology;  Laterality: Right;  . CYSTOSCOPY WITH RETROGRADE PYELOGRAM, URETEROSCOPY AND STENT PLACEMENT Left 11/07/2019   Procedure: CYSTOSCOPY WITH RETROGRADE PYELOGRAM, URETEROSCOPY AND STENT PLACEMENT;  Surgeon: Sebastian Ache, MD;  Location: [facility];  Service: Urology;  Laterality: Left;  90 MINS  . CYSTOSCOPY WITH RETROGRADE PYELOGRAM, URETEROSCOPY AND STENT PLACEMENT Left 11/26/2019   Procedure: CYSTOSCOPY WITH RETROGRADE PYELOGRAM, URETEROSCOPY AND STENT EXCHANGE;  Surgeon: Sebastian Ache, MD;  Location: [facility];  Service: Urology;  Laterality: Left;  75 MINS  . CYSTOSCOPY WITH URETEROSCOPY, STONE BASKETRY AND STENT PLACEMENT Right 07/16/2015   Procedure: CYSTOSCOPY WITH URETEROSCOPY, STONE BASKETRY;  Surgeon: Ihor Gully, MD;  Location[facility];  Service: Urology;  Laterality: Right;  . CYSTOSCOPY/URETEROSCOPY/HOLMIUM LASER/STENT PLACEMENT Right 05/21/2015   Procedure: RIGHT URETEROSCOPY/HOLMIUM LASER LITHOTRIPSY/RIGHT STENT PLACEMENT;  Surgeon: Ihor Gully, MD;  Location: [facility];  Service: Urology;  Laterality: Right;  . CYSTOSCOPY/URETEROSCOPY/HOLMIUM LASER/STENT PLACEMENT Left 07/21/2019  Procedure: CYSTOSCOPY/URETEROSCOPY/HOLMIUM LASER/STENT PLACEMENT;  Surgeon: Bjorn Pippin, MD;  Location: [facility];  Service: Urology;  Laterality: Left;  . CYTSO/  LEFT URETERAL  STENT PLACEMENT  04-12-2006  &  06-05-2002  . DILATION AND CURETTAGE OF UTERUS  04-09-2004   W/  SUCTION  . HOLMIUM LASER APPLICATION Left 09/25/2013   Procedure: HOLMIUM LASER APPLICATION;  Surgeon: Garnett Farm, MD;  Location[facility];  Service: Urology;  Laterality: Left;  . HOLMIUM LASER APPLICATION N/A 12/15/2013   Procedure: HOLMIUM LASER APPLICATION;  Surgeon: Garnett Farm, MD;  Location: [facility];  Service: Urology;  Laterality: N/A;  . HOLMIUM LASER APPLICATION Left 11/07/2019   Procedure: HOLMIUM LASER APPLICATION;  Surgeon: Sebastian Ache, MD;  Location: [facility];  Service: Urology;  Laterality: Left;  . HOLMIUM LASER APPLICATION Left 11/26/2019   Procedure: HOLMIUM LASER APPLICATION;  Surgeon: Sebastian Ache, MD;  Location: [facility];  Service: Urology;  Laterality: Left;  . NEPHROLITHOTOMY Right 12/15/2013   Procedure: RIGHT PERCUTANEOUS NEPHROLITHOTOMY ;  Surgeon: Garnett Farm, MD;  Location: [facility];  Service: Urology;  Laterality: Right;   staghorn  . PERCUTANEOUS NEPHROSTOLITHOTOMY Bilateral LEFT  05-30-2002/   RIGHT 12-13-1999  . RIGHT URETEROSCOPIC STONE EXTRACTION / STENT PLACEMENT  07-03-2000    Social History   Socioeconomic History  . Marital status: Married    Spouse name: Not on file  . Number of children: Not on file  . Years of education: Not on file  . Highest education level: Not on file  Occupational History  . 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 Smoking status: Never Smoker  . Smokeless tobacco: Never Used  Vaping Use  . Vaping Use: Never used  Substance and Sexual Activity  . Alcohol use: Yes    Comment: rarely  . Drug use: No  . Sexual activity: Not on file  Other Topics Concern  . Not on file  Social History Narrative  . Not on file   Social Determinants of Health   Financial Resource Strain:   . Difficulty of Paying Living Expenses: Not on file  Food Insecurity:   .  Worried About Programme researcher, broadcasting/film/video in the Last Year: Not on file  . Ran Out of Food in the Last Year: Not on file  Transportation Needs:   . Lack of Transportation (Medical): Not on file  . Lack of Transportation (Non-Medical): Not on file  Physical Activity:   . Days of Exercise per Week: Not on file  . Minutes of Exercise per Session: Not on file  Stress:   . Feeling of Stress : Not on file  Social Connections:   . Frequency of Communication with Friends and Family: Not on file  . Frequency of Social Gatherings with Friends and Family: Not on file  . Attends Religious Services: Not on file  . Active Member of Clubs or Organizations: Not on file  . Attends Banker Meetings: Not on file  . Marital Status: Not on file  Intimate Partner Violence:   . Fear of Current or Ex-Partner: Not on file  . Emotionally Abused: Not on file  . Physically Abused: Not on file  . Sexually Abused: Not on file    Family History  Problem Relation Age of Onset  . Healthy Mother   . Healthy Brother     ROS: no fevers or chills, productive cough, hemoptysis, dysphasia, odynophagia, melena, hematochezia,  dysuria, hematuria, rash, seizure activity, orthopnea, PND, pedal edema, claudication. Remaining systems are negative.  Physical Exam: Well-developed well-nourished in no acute distress.  Skin is warm and dry.  HEENT is normal.  Neck is supple.  Chest is clear to auscultation with normal expansion.  Cardiovascular exam is regular rate and rhythm.  Abdominal exam nontender or distended. No masses palpated. Extremities show no edema. neuro grossly intact  ECG-normal sinus rhythm with no ST changes.  Personally reviewed  A/P  1 SVT-no recurrences; continue beta blocker.  2 Palpiations-previously associated with PVCs; continue beta blocker.  3 hypertension-blood pressure controlled.  Continue present medical regimen.  Olga Millers, MD

## 2020-01-08 ENCOUNTER — Other Ambulatory Visit: Payer: Self-pay

## 2020-01-08 ENCOUNTER — Encounter: Payer: Self-pay | Admitting: Cardiology

## 2020-01-08 ENCOUNTER — Ambulatory Visit: Payer: BC Managed Care – PPO | Admitting: Cardiology

## 2020-01-08 VITALS — BP 126/79 | HR 66 | Ht 64.0 in | Wt 169.0 lb

## 2020-01-08 DIAGNOSIS — R002 Palpitations: Secondary | ICD-10-CM

## 2020-01-08 DIAGNOSIS — I10 Essential (primary) hypertension: Secondary | ICD-10-CM | POA: Diagnosis not present

## 2020-01-08 DIAGNOSIS — Z8679 Personal history of other diseases of the circulatory system: Secondary | ICD-10-CM

## 2020-01-08 MED ORDER — METOPROLOL SUCCINATE ER 50 MG PO TB24: ORAL_TABLET | ORAL | 3 refills | Status: DC

## 2020-01-08 NOTE — Patient Instructions (Signed)
Medication Instructions:  Refill sent to the pharmacy electronically.  *If you need a refill on your cardiac medications before your next appointment, please call your pharmacy*   Follow-Up: At [facility], you and your health needs are our priority.  As part of our continuing mission to provide you with exceptional heart care, we have created designated Provider Care Teams.  These Care Teams include your primary Cardiologist (physician) and Advanced Practice Providers (APPs -  Physician Assistants and Nurse Practitioners) who all work together to provide you with the care you need, when you need it.  We recommend signing up for the patient portal called "MyChart".  Sign up information is provided on this After Visit Summary.  MyChart is used to connect with patients for Virtual Visits (Telemedicine).  Patients are able to view lab/test results, encounter notes, upcoming appointments, etc.  Non-urgent messages can be sent to your provider as well.   To learn more about what you can do with MyChart, go to [email or web address].    Your next appointment:   12 month(s)  The format for your next appointment:   In Person  Provider:   You may see Olga Millers, MD or one of the following Advanced Practice Providers on your designated Care Team:    Corine Shelter, PA-C  [place], [place]  Edd Fabian, [place]

## 2020-01-15 NOTE — Addendum Note (Signed)
Addended by: Chana Bode on: 01/15/2020 02:03 PM   Modules accepted: Orders

## 2020-07-07 ENCOUNTER — Other Ambulatory Visit: Payer: Self-pay | Admitting: Obstetrics & Gynecology

## 2020-07-07 DIAGNOSIS — Z1231 Encounter for screening mammogram for malignant neoplasm of breast: Secondary | ICD-10-CM

## 2020-09-03 ENCOUNTER — Other Ambulatory Visit: Payer: Self-pay | Admitting: Internal Medicine

## 2020-09-03 ENCOUNTER — Other Ambulatory Visit: Payer: Self-pay

## 2020-09-03 ENCOUNTER — Ambulatory Visit
Admission: RE | Admit: 2020-09-03 | Discharge: 2020-09-03 | Disposition: A | Discharge status: Home / Self Care | Payer: BC Managed Care – PPO | Source: Ambulatory Visit | Attending: Obstetrics & Gynecology | Admitting: Obstetrics & Gynecology

## 2020-09-03 DIAGNOSIS — Z1231 Encounter for screening mammogram for malignant neoplasm of breast: Secondary | ICD-10-CM | POA: Diagnosis not present

## 2020-09-03 DIAGNOSIS — R928 Other abnormal and inconclusive findings on diagnostic imaging of breast: Secondary | ICD-10-CM

## 2020-09-06 ENCOUNTER — Other Ambulatory Visit: Payer: Self-pay

## 2020-09-06 ENCOUNTER — Ambulatory Visit
Admission: RE | Admit: 2020-09-06 | Discharge: 2020-09-06 | Disposition: A | Discharge status: Home / Self Care | Payer: BC Managed Care – PPO | Source: Ambulatory Visit | Attending: Internal Medicine | Admitting: Internal Medicine

## 2020-09-06 DIAGNOSIS — R928 Other abnormal and inconclusive findings on diagnostic imaging of breast: Secondary | ICD-10-CM

## 2020-09-23 ENCOUNTER — Other Ambulatory Visit: Payer: BC Managed Care – PPO

## 2020-12-08 DIAGNOSIS — I878 Other specified disorders of veins: Secondary | ICD-10-CM | POA: Diagnosis not present

## 2020-12-08 DIAGNOSIS — N200 Calculus of kidney: Secondary | ICD-10-CM | POA: Diagnosis not present

## 2020-12-08 DIAGNOSIS — R1032 Left lower quadrant pain: Secondary | ICD-10-CM | POA: Diagnosis not present

## 2021-02-04 ENCOUNTER — Other Ambulatory Visit: Payer: Self-pay | Admitting: Cardiology

## 2021-02-10 DIAGNOSIS — Z01419 Encounter for gynecological examination (general) (routine) without abnormal findings: Secondary | ICD-10-CM | POA: Diagnosis not present

## 2021-02-10 DIAGNOSIS — Z6827 Body mass index (BMI) 27.0-27.9, adult: Secondary | ICD-10-CM | POA: Diagnosis not present

## 2021-02-16 DIAGNOSIS — R000 Tachycardia, unspecified: Secondary | ICD-10-CM | POA: Diagnosis not present

## 2021-02-16 DIAGNOSIS — I10 Essential (primary) hypertension: Secondary | ICD-10-CM | POA: Diagnosis not present

## 2021-02-16 DIAGNOSIS — E041 Nontoxic single thyroid nodule: Secondary | ICD-10-CM | POA: Diagnosis not present

## 2021-04-19 DIAGNOSIS — L820 Inflamed seborrheic keratosis: Secondary | ICD-10-CM | POA: Diagnosis not present

## 2021-04-19 DIAGNOSIS — L918 Other hypertrophic disorders of the skin: Secondary | ICD-10-CM | POA: Diagnosis not present

## 2021-04-19 DIAGNOSIS — L7211 Pilar cyst: Secondary | ICD-10-CM | POA: Diagnosis not present

## 2021-08-31 DIAGNOSIS — Z89211 Acquired absence of right upper limb below elbow: Secondary | ICD-10-CM | POA: Diagnosis not present

## 2021-09-01 ENCOUNTER — Other Ambulatory Visit: Payer: Self-pay | Admitting: Internal Medicine

## 2021-09-01 DIAGNOSIS — Z1231 Encounter for screening mammogram for malignant neoplasm of breast: Secondary | ICD-10-CM

## 2021-09-09 ENCOUNTER — Ambulatory Visit: Payer: BC Managed Care – PPO

## 2021-09-09 ENCOUNTER — Ambulatory Visit
Admission: RE | Admit: 2021-09-09 | Discharge: 2021-09-09 | Disposition: A | Discharge status: Home / Self Care | Payer: BC Managed Care – PPO | Source: Ambulatory Visit | Attending: Internal Medicine | Admitting: Internal Medicine

## 2021-09-09 DIAGNOSIS — Z1231 Encounter for screening mammogram for malignant neoplasm of breast: Secondary | ICD-10-CM

## 2022-02-03 ENCOUNTER — Other Ambulatory Visit: Payer: Self-pay | Admitting: Cardiology

## 2022-02-24 DIAGNOSIS — Z87442 Personal history of urinary calculi: Secondary | ICD-10-CM | POA: Diagnosis not present

## 2022-03-03 ENCOUNTER — Other Ambulatory Visit: Payer: Self-pay | Admitting: Physician Assistant

## 2022-03-06 ENCOUNTER — Other Ambulatory Visit: Payer: Self-pay

## 2022-03-07 MED ORDER — METOPROLOL SUCCINATE ER 50 MG PO TB24: ORAL_TABLET | ORAL | 0 refills | Status: DC

## 2022-04-13 DIAGNOSIS — Z01419 Encounter for gynecological examination (general) (routine) without abnormal findings: Secondary | ICD-10-CM | POA: Diagnosis not present

## 2022-04-13 DIAGNOSIS — Z6832 Body mass index (BMI) 32.0-32.9, adult: Secondary | ICD-10-CM | POA: Diagnosis not present

## 2022-04-18 NOTE — Progress Notes (Signed)
HPI: FU hypertension, SVT. According to previous notes when she was cared for by Dr Corinda Gubler, she has a history of SVT although I do not have actual strips. Nuclear study 2003 showed no scar or ischemia with an ejection fraction 74%. Previously seen with palpitations; electrocardiogram was performed and revealed sinus rhythm with PVCs. Echo 10/18 showed normal LV function and mild LAE. Since I last saw her,  the patient denies any dyspnea on exertion, orthopnea, PND, pedal edema, palpitations, syncope or chest pain.   Current Outpatient Medications  Medication Sig Dispense Refill   metoprolol succinate (TOPROL-XL) 50 MG 24 hr tablet Take 1 tablet by mouth daily. PATIENT MUST KEEP SCHEDULED OFFICE VISIT FOR FUTURE REFILLS. 60 tablet 0   omeprazole (PRILOSEC) 20 MG capsule Take 20 mg by mouth daily as needed (indigestion/heartburn.).      potassium citrate (UROCIT-K) 10 MEQ (1080 MG) SR tablet Take 20 mEq by mouth daily.     No current facility-administered medications for this visit.     Past Medical History:  Diagnosis Date   Congenital absence of right upper extremity    WEARS PROSTHESIS  RIGHT ARM   History of kidney stones    CYSTINE STONES   Hypertension    Multiple thyroid nodules    W/ GOITER--  LAST BX  BENIGN   PONV (postoperative nausea and vomiting)    PT STATES ZOFRAN IN SURGERY HELPS   PSVT (paroxysmal supraventricular tachycardia)    CARDIOLOGIST --  DR Herald Vallin  "NO PROBLEMS IN LONG TIME" WITH FAST HEART RATE   Renal calculus, right    Scarlet fever with other complications 1970    Past Surgical History:  Procedure Laterality Date   CYSTO/  BILATERAL URETEROSCOPIC LASER LITHOTRIPSY/  BILATERAL URETERAL STONE EXTRACTION/  BILATERAL STENT PLACEMENT  01-22-2008   CYSTO/  RIGHT RETROGRADE PYELOGRAM/ RIGHT URETEROSCOPY  02-10-2005/   02-17-2005/   03-18-2001   02-17-2005-- STENT PLACEMENT   CYSTO/  RIGHT URETERAL STENT PLACEMENT  07-10-2000   CYSTOSCOPY W/  RETROGRADES Right 05/21/2015   Procedure: CYSTOSCOPY WITH RETROGRADE PYELOGRAM;  Surgeon: Ihor Gully, MD;  Location: [facility];  Service: Urology;  Laterality: Right;   CYSTOSCOPY WITH RETROGRADE PYELOGRAM, URETEROSCOPY AND STENT PLACEMENT Left 09/25/2013   Procedure: LEFT RETROGRADE PYELOGRAM, URETEROSCOPY LASER LITHO AND STENT PLACEMENT;  Surgeon: Garnett Farm, MD;  Location: [facility];  Service: Urology;  Laterality: Left;   CYSTOSCOPY WITH RETROGRADE PYELOGRAM, URETEROSCOPY AND STENT PLACEMENT Right 07/16/2015   Procedure: CYSTOSCOPY WITH RETROGRADE PYELOGRAM;  Surgeon: Ihor Gully, MD;  [facility];  Service: Urology;  Laterality: Right;   CYSTOSCOPY WITH RETROGRADE PYELOGRAM, URETEROSCOPY AND STENT PLACEMENT Left 11/07/2019   Procedure: CYSTOSCOPY WITH RETROGRADE PYELOGRAM, URETEROSCOPY AND STENT PLACEMENT;  Surgeon: Sebastian Ache, MD;  Location: [facility];  Service: Urology;  Laterality: Left;  90 MINS   CYSTOSCOPY WITH RETROGRADE PYELOGRAM, URETEROSCOPY AND STENT PLACEMENT Left 11/26/2019   Procedure: CYSTOSCOPY WITH RETROGRADE PYELOGRAM, URETEROSCOPY AND STENT EXCHANGE;  Surgeon: Sebastian Ache, MD;  Location: [facility];  Service: Urology;  Laterality: Left;  75 MINS   CYSTOSCOPY WITH URETEROSCOPY, STONE BASKETRY AND STENT PLACEMENT Right 07/16/2015   Procedure: CYSTOSCOPY WITH URETEROSCOPY, STONE BASKETRY;  Surgeon: Ihor Gully, MD;  Location: [facility];  Service: Urology;  Laterality: Right;   CYSTOSCOPY/URETEROSCOPY/HOLMIUM LASER/STENT PLACEMENT Right 05/21/2015   Procedure: RIGHT URETEROSCOPY/HOLMIUM LASER LITHOTRIPSY/RIGHT STENT PLACEMENT;  Surgeon: Ihor Gully, MD;  Location: [facility];  Service: Urology;  Laterality: Right;   CYSTOSCOPY/URETEROSCOPY/HOLMIUM LASER/STENT PLACEMENT Left 07/21/2019   Procedure: CYSTOSCOPY/URETEROSCOPY/HOLMIUM LASER/STENT PLACEMENT;  Surgeon: Bjorn Pippin, MD;  Location:  [facility];  Service: Urology;  Laterality: Left;   CYTSO/  LEFT URETERAL STENT PLACEMENT  04-12-2006  &  06-05-2002   DILATION AND CURETTAGE OF UTERUS  04-09-2004   W/  SUCTION   HOLMIUM LASER APPLICATION Left 09/25/2013   Procedure: HOLMIUM LASER APPLICATION;  Surgeon: Garnett Farm, MD;  Location: [facility];  Service: Urology;  Laterality: Left;   HOLMIUM LASER APPLICATION N/A 12/15/2013   Procedure: HOLMIUM LASER APPLICATION;  Surgeon: Garnett Farm, MD;  Location: [facility];  Service: Urology;  Laterality: N/A;   HOLMIUM LASER APPLICATION Left 11/07/2019   Procedure: HOLMIUM LASER APPLICATION;  Surgeon: Sebastian Ache, MD;  Location: [facility];  Service: Urology;  Laterality: Left;   HOLMIUM LASER APPLICATION Left 11/26/2019   Procedure: HOLMIUM LASER APPLICATION;  Surgeon: Sebastian Ache, MD;  Location: [facility];  Service: Urology;  Laterality: Left;   NEPHROLITHOTOMY Right 12/15/2013   Procedure: RIGHT PERCUTANEOUS NEPHROLITHOTOMY ;  Surgeon: Garnett Farm, MD;  Location: [facility];  Service: Urology;  Laterality: Right;   [place]   PERCUTANEOUS NEPHROSTOLITHOTOMY Bilateral LEFT  05-30-2002/   RIGHT 12-13-1999   RIGHT URETEROSCOPIC STONE EXTRACTION / STENT PLACEMENT  07-03-2000    Social History   Socioeconomic History   Marital status: Married    Spouse name: Not on file   Number of children: Not on file   Years of education: Not on file   Highest education level: Not on file  Occupational History   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[place], [place]  Tobacco Use   Smoking status: Never   Smokeless tobacco: Never  Vaping Use   Vaping Use: Never used  Substance and Sexual Activity   Alcohol use: Yes    Comment: rarely   Drug use: No   Sexual activity: Not on file  Other Topics Concern   Not on file  Social History Narrative   Not on file   Social Determinants of Health   Financial Resource Strain: Not on file  Food Insecurity: Not on file   Transportation Needs: Not on file  Physical Activity: Not on file  Stress: Not on file  Social Connections: Not on file  Intimate Partner Violence: Not on file    Family History  Problem Relation Age of Onset   Healthy Mother    Healthy Brother     ROS: no fevers or chills, productive cough, hemoptysis, dysphasia, odynophagia, melena, hematochezia, dysuria, hematuria, rash, seizure activity, orthopnea, PND, pedal edema, claudication. Remaining systems are negative.  Physical Exam: Well-developed well-nourished in no acute distress.  Skin is warm and dry.  HEENT is normal.  Neck is supple.  Chest is clear to auscultation with normal expansion.  Cardiovascular exam is regular rate and rhythm.  Abdominal exam nontender or distended. No masses palpated. Extremities show no edema. neuro grossly intact  ECG-normal sinus rhythm at a rate of 65, early repolarization abnormality.  Personally reviewed  A/P  1 history of supraventricular tachycardia-patient doing well with no recurrences.  Plan to continue beta-blocker.  2 hypertension-blood pressure controlled.  Continue present medications.  3 history of palpitations-symptoms are controlled.  Continue beta-blocker.  Olga Millers, MD

## 2022-04-28 ENCOUNTER — Encounter: Payer: Self-pay | Admitting: Cardiology

## 2022-04-28 ENCOUNTER — Ambulatory Visit: Payer: BC Managed Care – PPO | Attending: Cardiology | Admitting: Cardiology

## 2022-04-28 VITALS — BP 120/80 | HR 65 | Ht 64.0 in | Wt 189.2 lb

## 2022-04-28 DIAGNOSIS — Z8679 Personal history of other diseases of the circulatory system: Secondary | ICD-10-CM

## 2022-04-28 DIAGNOSIS — I10 Essential (primary) hypertension: Secondary | ICD-10-CM

## 2022-04-28 DIAGNOSIS — R002 Palpitations: Secondary | ICD-10-CM | POA: Diagnosis not present

## 2022-04-28 MED ORDER — METOPROLOL SUCCINATE ER 50 MG PO TB24: 50.0000 mg | ORAL_TABLET | Freq: Every day | ORAL | 3 refills | Status: DC

## 2022-04-28 NOTE — Patient Instructions (Signed)
  Follow-Up: At [facility], you and your health needs are our priority.  As part of our continuing mission to provide you with exceptional heart care, we have created designated Provider Care Teams.  These Care Teams include your primary Cardiologist (physician) and Advanced Practice Providers (APPs -  Physician Assistants and Nurse Practitioners) who all work together to provide you with the care you need, when you need it.  We recommend signing up for the patient portal called "MyChart".  Sign up information is provided on this After Visit Summary.  MyChart is used to connect with patients for Virtual Visits (Telemedicine).  Patients are able to view lab/test results, encounter notes, upcoming appointments, etc.  Non-urgent messages can be sent to your provider as well.   To learn more about what you can do with MyChart, go to [email or web address].    Your next appointment:   12 month(s)  The format for your next appointment:   In Person  Provider:   Brian Crenshaw, MD   

## 2022-08-24 ENCOUNTER — Other Ambulatory Visit: Payer: Self-pay | Admitting: Internal Medicine

## 2022-08-24 DIAGNOSIS — Z1231 Encounter for screening mammogram for malignant neoplasm of breast: Secondary | ICD-10-CM

## 2022-08-29 DIAGNOSIS — Q7121 Congenital absence of both forearm and hand, right upper limb: Secondary | ICD-10-CM | POA: Diagnosis not present

## 2022-08-29 DIAGNOSIS — R000 Tachycardia, unspecified: Secondary | ICD-10-CM | POA: Diagnosis not present

## 2022-08-29 DIAGNOSIS — I10 Essential (primary) hypertension: Secondary | ICD-10-CM | POA: Diagnosis not present

## 2022-08-29 DIAGNOSIS — Z87442 Personal history of urinary calculi: Secondary | ICD-10-CM | POA: Diagnosis not present

## 2022-10-04 ENCOUNTER — Ambulatory Visit
Admission: RE | Admit: 2022-10-04 | Discharge: 2022-10-04 | Disposition: A | Discharge status: Home / Self Care | Payer: BC Managed Care – PPO | Source: Ambulatory Visit | Attending: Internal Medicine | Admitting: Internal Medicine

## 2022-10-04 DIAGNOSIS — Z1231 Encounter for screening mammogram for malignant neoplasm of breast: Secondary | ICD-10-CM

## 2023-04-20 ENCOUNTER — Other Ambulatory Visit: Payer: Self-pay | Admitting: Cardiology

## 2023-08-21 ENCOUNTER — Other Ambulatory Visit: Payer: Self-pay | Admitting: Internal Medicine

## 2023-08-21 DIAGNOSIS — Z1231 Encounter for screening mammogram for malignant neoplasm of breast: Secondary | ICD-10-CM

## 2023-10-23 ENCOUNTER — Ambulatory Visit
Admission: RE | Admit: 2023-10-23 | Discharge: 2023-10-23 | Disposition: A | Discharge status: Home / Self Care | Source: Ambulatory Visit | Attending: Internal Medicine | Admitting: Internal Medicine

## 2023-10-23 DIAGNOSIS — Z1231 Encounter for screening mammogram for malignant neoplasm of breast: Secondary | ICD-10-CM

## 2024-04-17 ENCOUNTER — Other Ambulatory Visit: Payer: Self-pay | Admitting: Cardiology
